# Patient Record
Sex: Female | Born: 1935 | Race: White | Hispanic: No | Smoking: Former smoker
Health system: Southern US, Community
[De-identification: ages and names within clinical notes are randomized; demographics above are authoritative.]

## PROBLEM LIST (undated history)

## (undated) DIAGNOSIS — N289 Disorder of kidney and ureter, unspecified: Secondary | ICD-10-CM

## (undated) DIAGNOSIS — H409 Unspecified glaucoma: Secondary | ICD-10-CM

## (undated) DIAGNOSIS — E049 Nontoxic goiter, unspecified: Secondary | ICD-10-CM

## (undated) DIAGNOSIS — I509 Heart failure, unspecified: Secondary | ICD-10-CM

## (undated) DIAGNOSIS — J449 Chronic obstructive pulmonary disease, unspecified: Secondary | ICD-10-CM

## (undated) DIAGNOSIS — S37009A Unspecified injury of unspecified kidney, initial encounter: Secondary | ICD-10-CM

## (undated) DIAGNOSIS — K635 Polyp of colon: Secondary | ICD-10-CM

## (undated) DIAGNOSIS — C801 Malignant (primary) neoplasm, unspecified: Secondary | ICD-10-CM

## (undated) DIAGNOSIS — D649 Anemia, unspecified: Secondary | ICD-10-CM

## (undated) DIAGNOSIS — E785 Hyperlipidemia, unspecified: Secondary | ICD-10-CM

## (undated) DIAGNOSIS — I10 Essential (primary) hypertension: Secondary | ICD-10-CM

## (undated) HISTORY — DX: Malignant (primary) neoplasm, unspecified: C80.1

## (undated) HISTORY — DX: Polyp of colon: K63.5

## (undated) HISTORY — DX: Chronic obstructive pulmonary disease, unspecified: J44.9

## (undated) HISTORY — DX: Unspecified glaucoma: H40.9

## (undated) HISTORY — DX: Essential (primary) hypertension: I10

## (undated) HISTORY — DX: Heart failure, unspecified: I50.9

## (undated) HISTORY — DX: Anemia, unspecified: D64.9

## (undated) HISTORY — DX: Hyperlipidemia, unspecified: E78.5

## (undated) HISTORY — DX: Nontoxic goiter, unspecified: E04.9

## (undated) HISTORY — DX: Disorder of kidney and ureter, unspecified: N28.9

## (undated) HISTORY — DX: Unspecified injury of unspecified kidney, initial encounter: S37.009A

---

## 1997-11-04 HISTORY — PX: ANKLE SURGERY: SHX546

## 2003-11-05 HISTORY — PX: CATARACT EXTRACTION: SUR2

## 2005-10-24 ENCOUNTER — Other Ambulatory Visit: Payer: Self-pay

## 2005-10-25 ENCOUNTER — Inpatient Hospital Stay: Payer: Self-pay

## 2005-11-04 DIAGNOSIS — C801 Malignant (primary) neoplasm, unspecified: Secondary | ICD-10-CM

## 2005-11-04 HISTORY — DX: Malignant (primary) neoplasm, unspecified: C80.1

## 2005-12-31 ENCOUNTER — Ambulatory Visit: Payer: Self-pay | Admitting: Vascular Surgery

## 2006-09-25 ENCOUNTER — Emergency Department: Payer: Self-pay | Admitting: Emergency Medicine

## 2006-12-27 IMAGING — CR LEFT THUMB 2+V
1 series · 3 of 3 positions shown · non-contrast
Comparison: none

REASON FOR EXAM: Injury
COMMENTS:

PROCEDURE:     DXR - DXR THUMB LEFT HAND (1ST DIGIT)  - September 25, 2006  [DATE]
RESULT:     No acute soft tissue or bony abnormality is identified. There is
no evidence of fracture or dislocation.

[Series 1: view not recorded · 0.17mm/px · 3 of 3 slices shown]
[im 1/3]
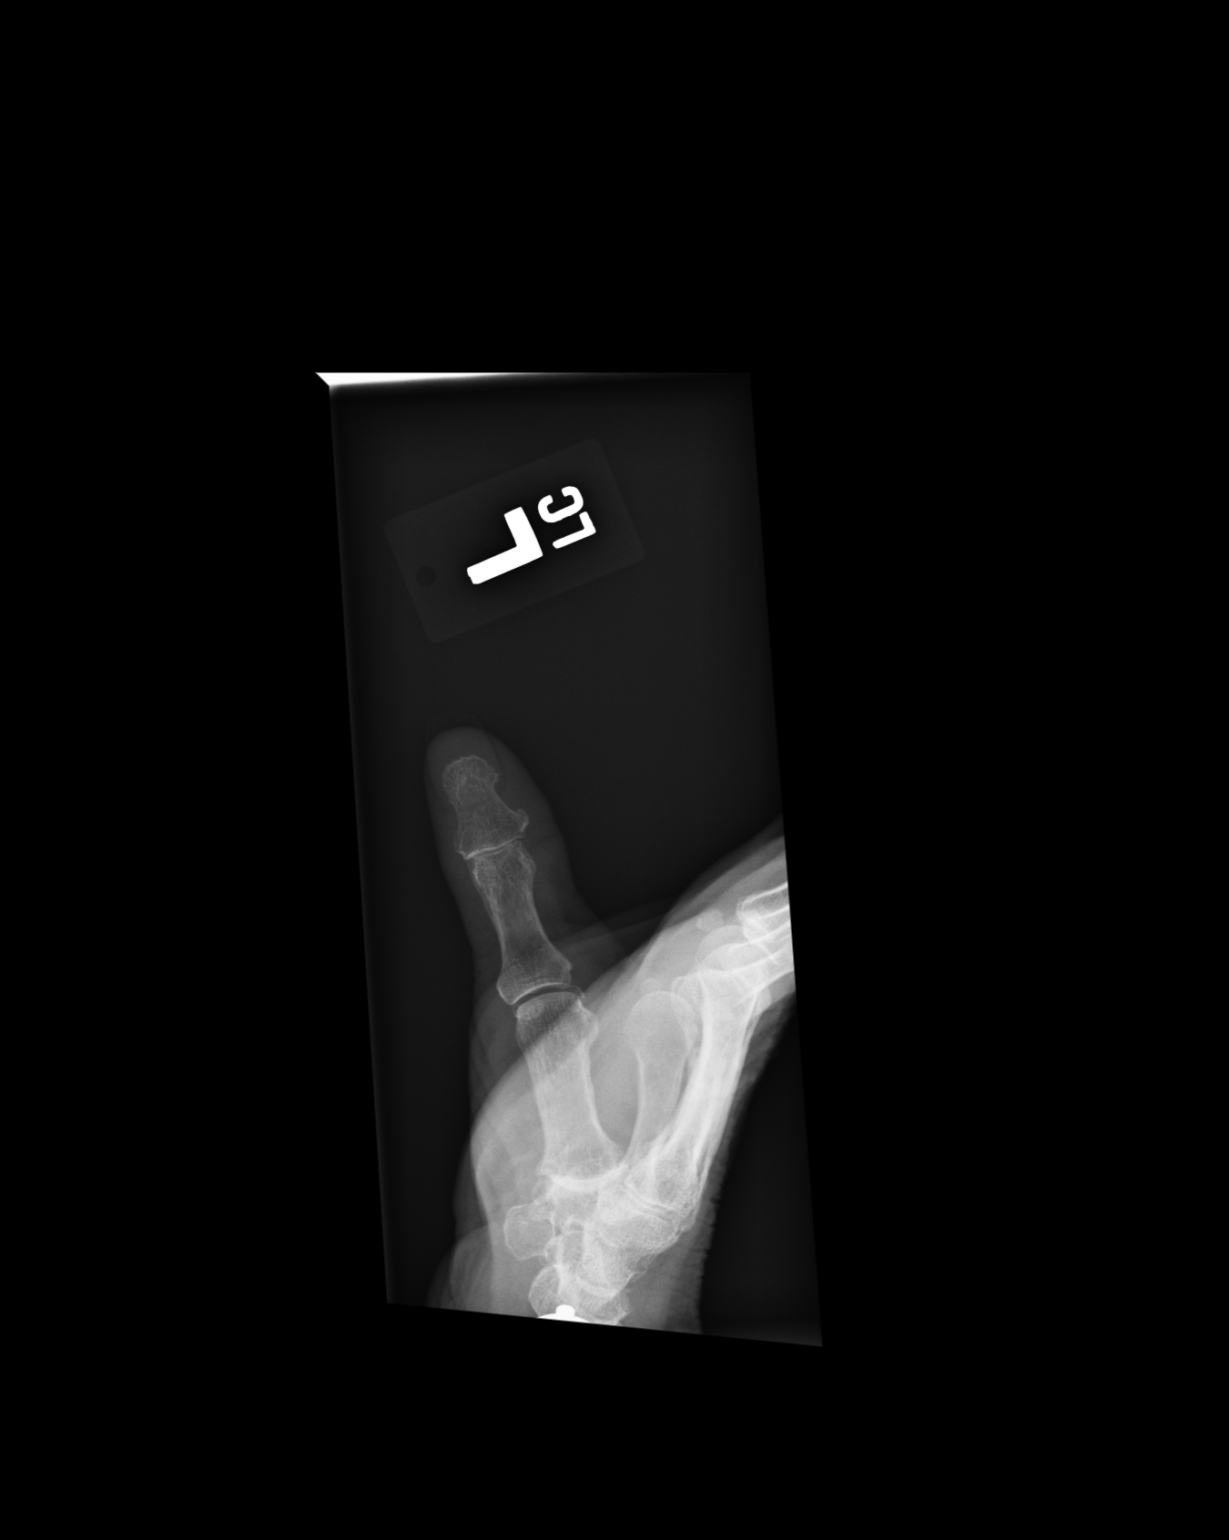
[im 2/3]
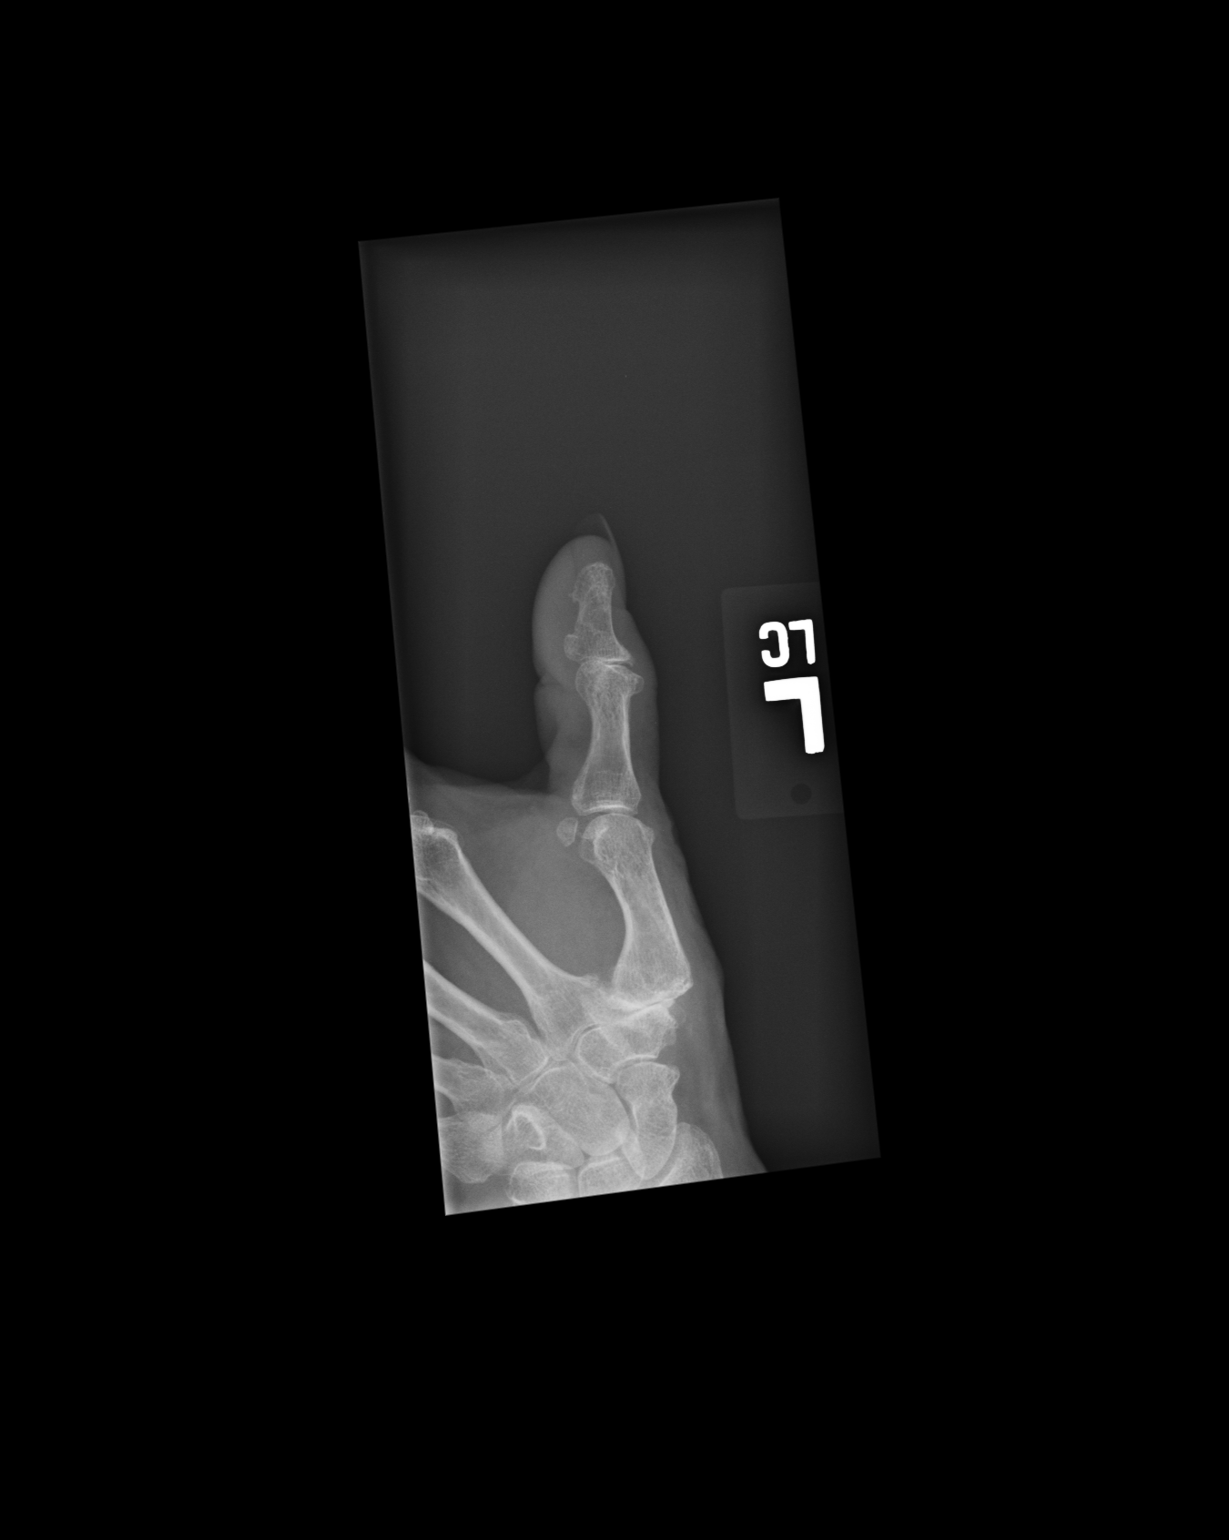
[im 3/3]
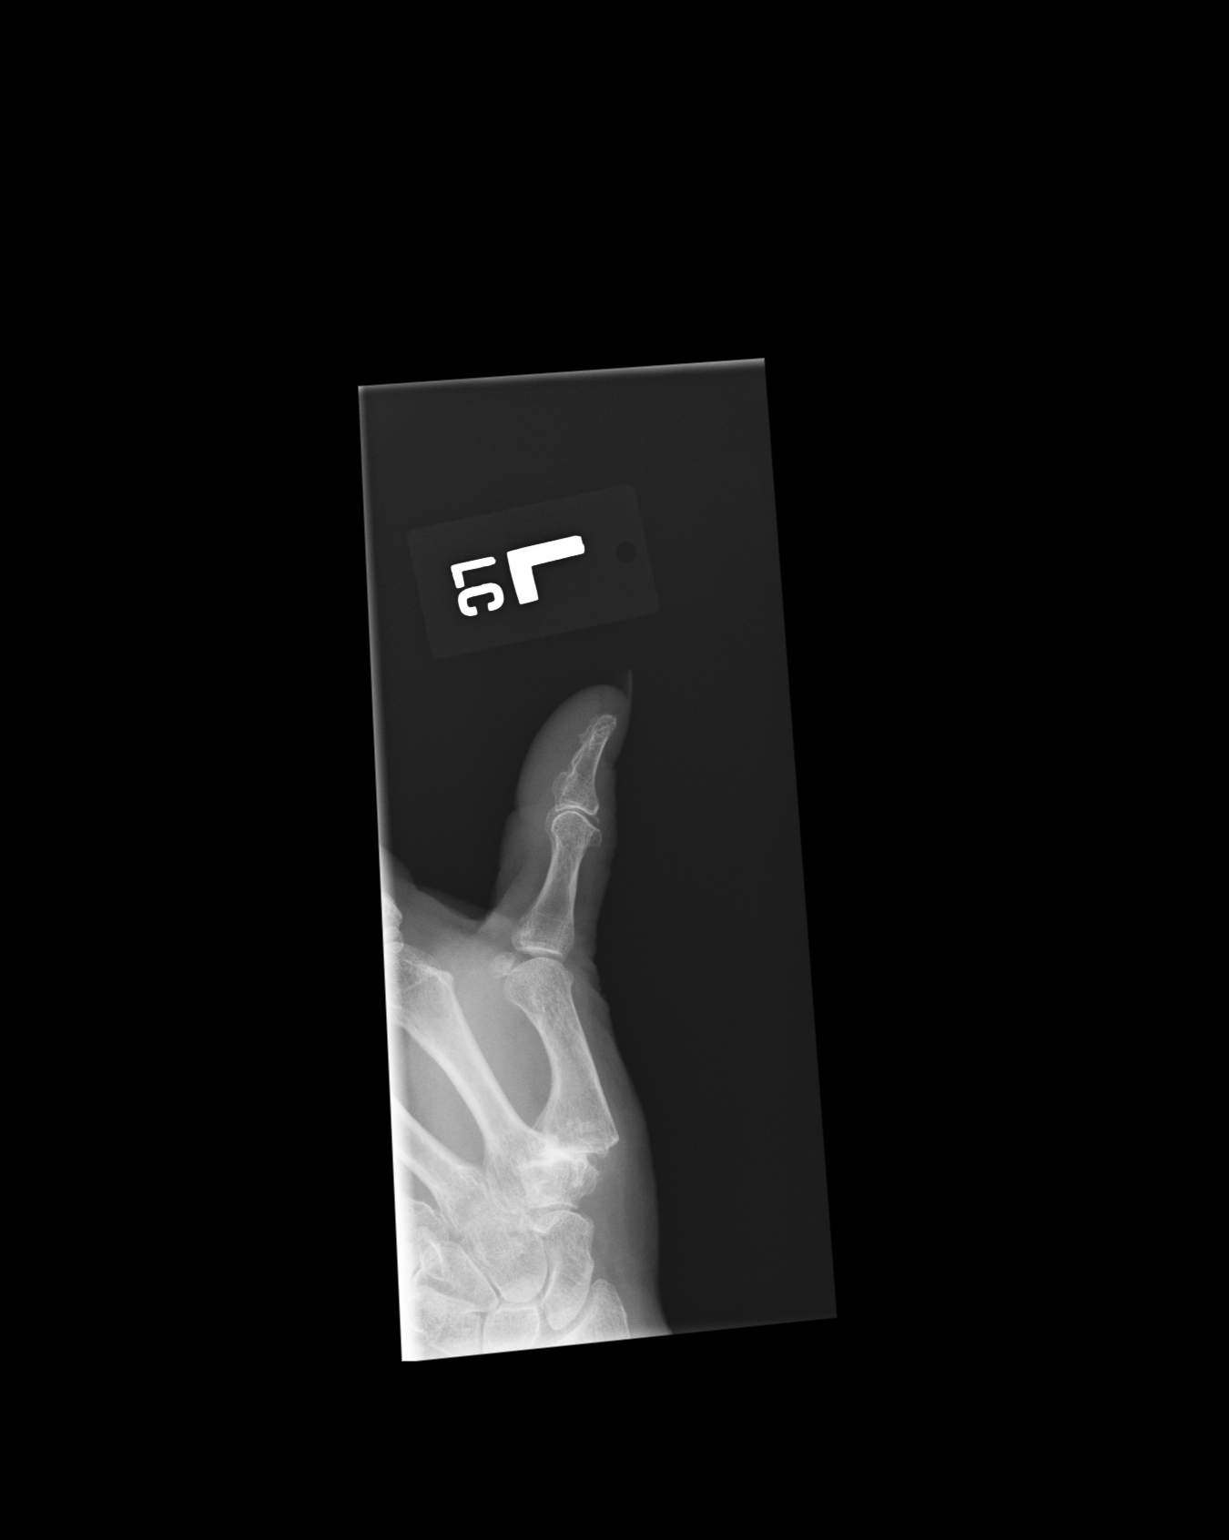

[3 of 3 positions shown; findings below may reference images not displayed]

IMPRESSION: No acute abnormality is identified.

## 2009-07-11 ENCOUNTER — Ambulatory Visit: Payer: Self-pay | Admitting: Nephrology

## 2009-08-04 ENCOUNTER — Ambulatory Visit: Payer: Self-pay | Admitting: Internal Medicine

## 2009-09-01 ENCOUNTER — Ambulatory Visit: Payer: Self-pay | Admitting: Internal Medicine

## 2009-11-04 ENCOUNTER — Inpatient Hospital Stay: Payer: Self-pay | Admitting: Internal Medicine

## 2009-12-22 ENCOUNTER — Inpatient Hospital Stay: Payer: Self-pay | Admitting: Internal Medicine

## 2010-03-01 ENCOUNTER — Ambulatory Visit: Payer: Self-pay | Admitting: Internal Medicine

## 2010-03-04 ENCOUNTER — Ambulatory Visit: Payer: Self-pay | Admitting: Internal Medicine

## 2010-09-12 ENCOUNTER — Ambulatory Visit: Payer: Self-pay | Admitting: Internal Medicine

## 2010-10-04 ENCOUNTER — Ambulatory Visit: Payer: Self-pay | Admitting: Internal Medicine

## 2011-09-13 ENCOUNTER — Ambulatory Visit: Payer: Self-pay | Admitting: Internal Medicine

## 2011-10-05 ENCOUNTER — Ambulatory Visit: Payer: Self-pay | Admitting: Internal Medicine

## 2011-10-30 ENCOUNTER — Ambulatory Visit: Payer: Self-pay | Admitting: Family Medicine

## 2011-11-05 DIAGNOSIS — E049 Nontoxic goiter, unspecified: Secondary | ICD-10-CM

## 2011-11-05 HISTORY — PX: EYE SURGERY: SHX253

## 2011-11-05 HISTORY — PX: BIOPSY THYROID: PRO38

## 2011-11-05 HISTORY — DX: Nontoxic goiter, unspecified: E04.9

## 2012-03-24 ENCOUNTER — Ambulatory Visit: Payer: Self-pay | Admitting: Family Medicine

## 2012-07-10 ENCOUNTER — Ambulatory Visit: Payer: Self-pay | Admitting: Family Medicine

## 2012-07-17 ENCOUNTER — Ambulatory Visit: Payer: Self-pay | Admitting: Family Medicine

## 2012-07-24 ENCOUNTER — Ambulatory Visit: Payer: Self-pay | Admitting: Vascular Surgery

## 2012-07-24 LAB — CREATININE, SERUM
EGFR (African American): 40 — ABNORMAL LOW
EGFR (Non-African Amer.): 35 — ABNORMAL LOW

## 2012-09-11 ENCOUNTER — Ambulatory Visit: Payer: Self-pay | Admitting: Internal Medicine

## 2012-09-11 LAB — CREATININE, SERUM
Creatinine: 1.48 mg/dL — ABNORMAL HIGH (ref 0.60–1.30)
EGFR (African American): 37 — ABNORMAL LOW
EGFR (Non-African Amer.): 32 — ABNORMAL LOW

## 2012-09-11 LAB — CBC CANCER CENTER
Basophil #: 0 x10 3/mm (ref 0.0–0.1)
Eosinophil #: 0.2 x10 3/mm (ref 0.0–0.7)
HCT: 36.5 % (ref 35.0–47.0)
HGB: 11.8 g/dL — ABNORMAL LOW (ref 12.0–16.0)
Lymphocyte #: 2.4 x10 3/mm (ref 1.0–3.6)
MCH: 33.8 pg (ref 26.0–34.0)
MCHC: 32.5 g/dL (ref 32.0–36.0)
MCV: 104 fL — ABNORMAL HIGH (ref 80–100)
Monocyte #: 0.6 x10 3/mm (ref 0.2–0.9)
Monocyte %: 6.5 %
Neutrophil #: 6.6 x10 3/mm — ABNORMAL HIGH (ref 1.4–6.5)
Platelet: 295 x10 3/mm (ref 150–440)
RBC: 3.5 10*6/uL — ABNORMAL LOW (ref 3.80–5.20)
RDW: 14.9 % — ABNORMAL HIGH (ref 11.5–14.5)

## 2012-09-11 LAB — CALCIUM: Calcium, Total: 8.9 mg/dL (ref 8.5–10.1)

## 2012-09-14 LAB — PROT IMMUNOELECTROPHORES(ARMC)

## 2012-09-25 ENCOUNTER — Emergency Department: Payer: Self-pay | Admitting: Emergency Medicine

## 2012-09-25 LAB — COMPREHENSIVE METABOLIC PANEL
BUN: 13 mg/dL (ref 7–18)
Bilirubin,Total: 0.4 mg/dL (ref 0.2–1.0)
Calcium, Total: 9.2 mg/dL (ref 8.5–10.1)
Chloride: 95 mmol/L — ABNORMAL LOW (ref 98–107)
Creatinine: 0.93 mg/dL (ref 0.60–1.30)
EGFR (African American): >60
EGFR (Non-African Amer.): 55 — ABNORMAL LOW
Glucose: 110 mg/dL — ABNORMAL HIGH (ref 65–99)
Potassium: 4.2 mmol/L (ref 3.5–5.1)
SGOT(AST): 22 U/L (ref 15–37)
SGPT (ALT): 27 U/L (ref 12–78)

## 2012-09-25 LAB — URINALYSIS, COMPLETE
Bacteria: NONE SEEN
Bilirubin,UR: NEGATIVE
Blood: NEGATIVE
Glucose,UR: NEGATIVE mg/dL (ref 0–75)
Nitrite: NEGATIVE
Protein: NEGATIVE
Squamous Epithelial: <1

## 2012-09-25 LAB — TROPONIN I: Troponin-I: 0.02 ng/mL

## 2012-09-25 LAB — CBC
HCT: 34.5 % — ABNORMAL LOW (ref 35.0–47.0)
MCHC: 33.4 g/dL (ref 32.0–36.0)
Platelet: 340 10*3/uL (ref 150–440)
RDW: 14.1 % (ref 11.5–14.5)
WBC: 6.6 10*3/uL (ref 3.6–11.0)

## 2012-09-25 LAB — CK TOTAL AND CKMB (NOT AT ARMC): CK-MB: 1.4 ng/mL (ref 0.5–3.6)

## 2012-10-04 ENCOUNTER — Ambulatory Visit: Payer: Self-pay | Admitting: Internal Medicine

## 2012-12-27 IMAGING — CR DG CHEST 2V
1 series · 2 of 2 positions shown · non-contrast
Comparison: none

REASON FOR EXAM: weak
COMMENTS:

[Series 1: w chest pa · 0.14mm/px · 2 of 2 slices shown]
[im 1/2]
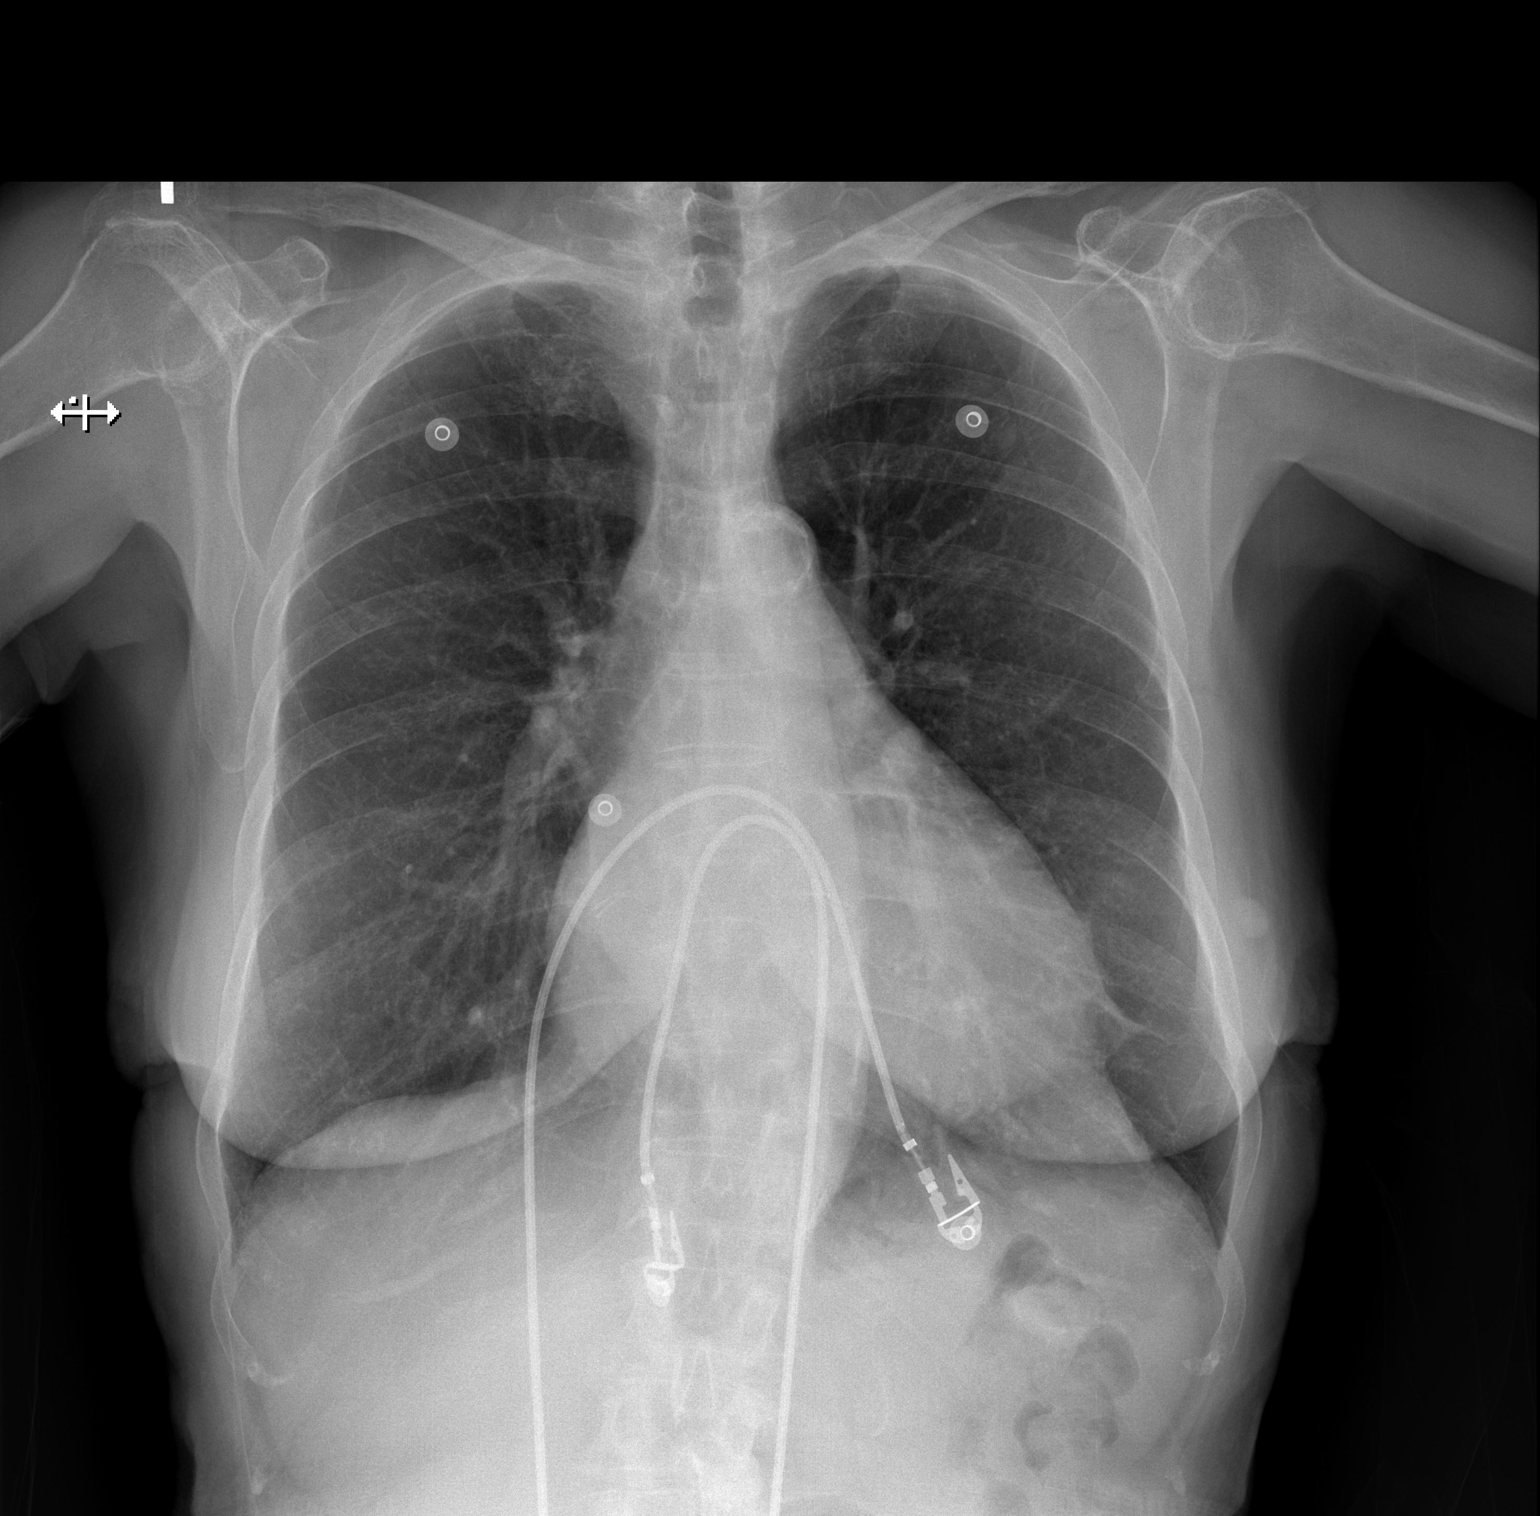
[im 2/2]
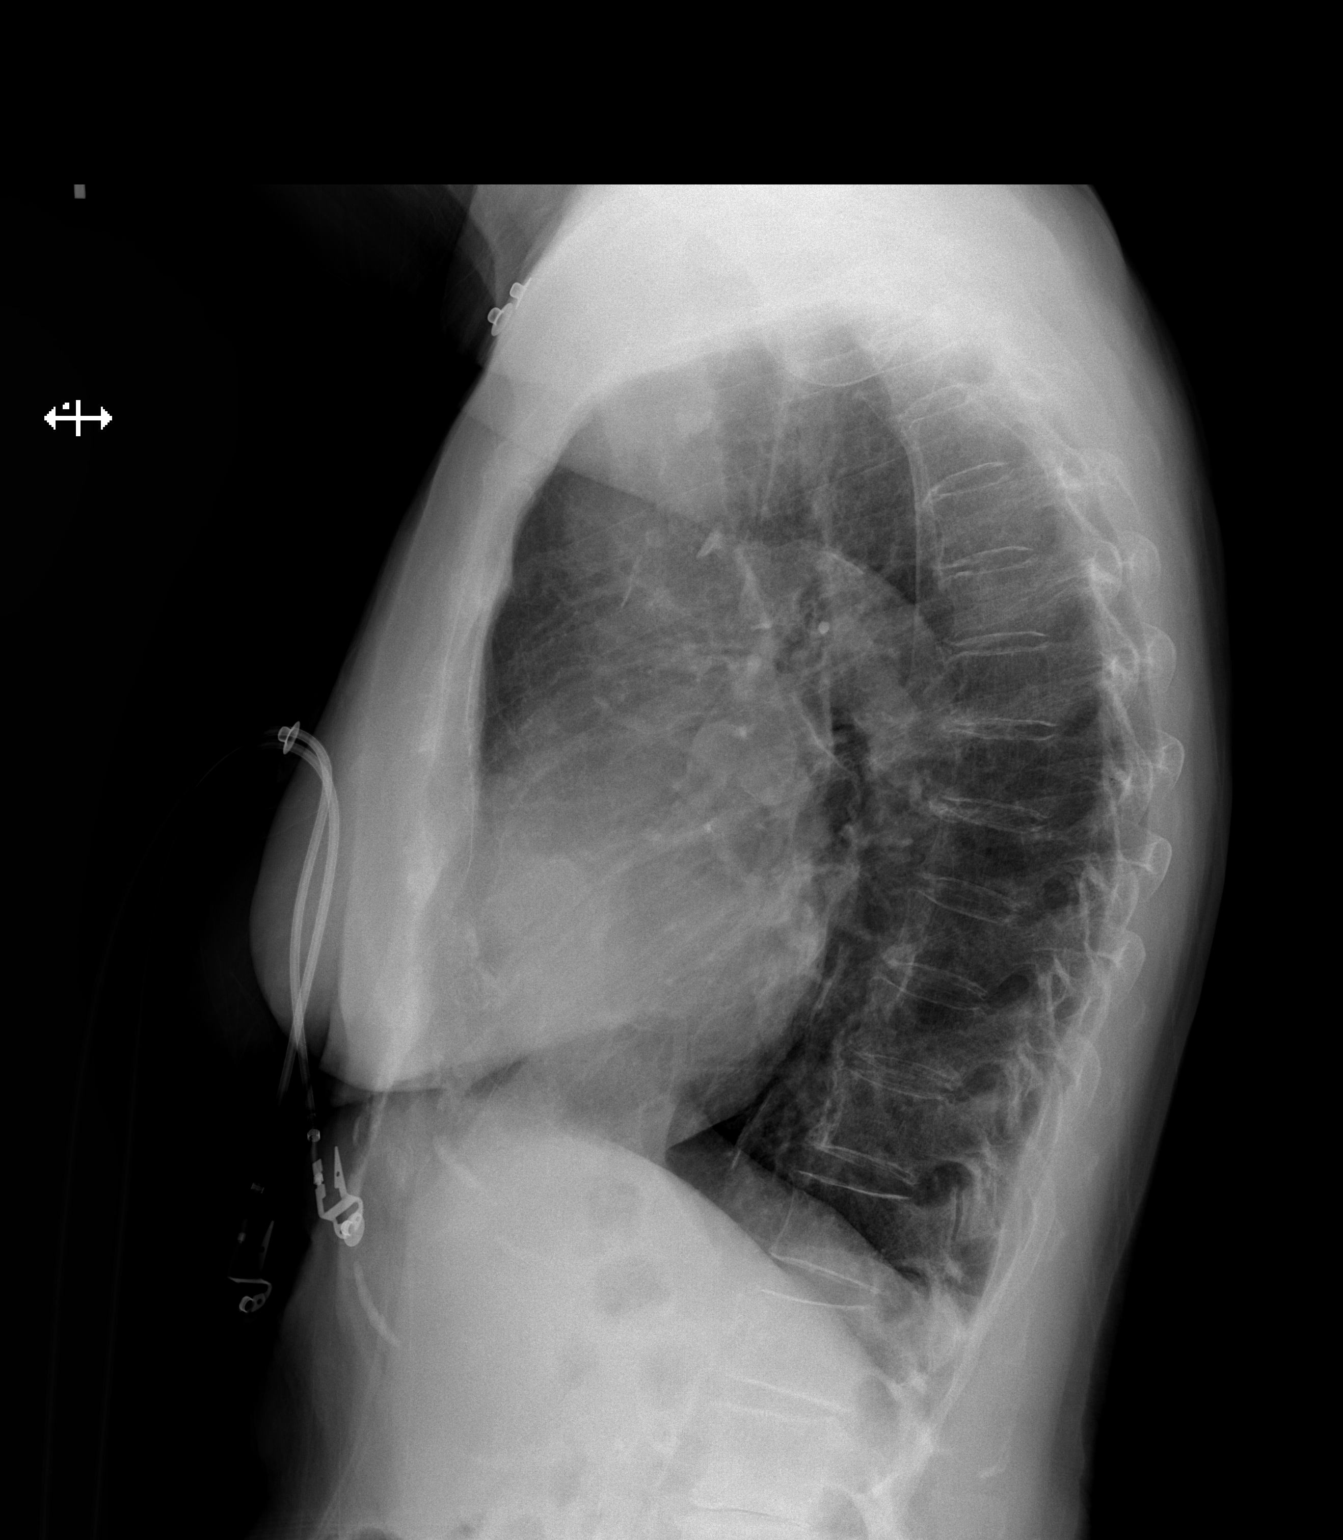

[2 of 2 positions shown; findings below may reference images not displayed]

PROCEDURE:     DXR - DXR CHEST PA (OR AP) AND LATERAL  - September 25, 2012  [DATE]

RESULT:

There is no evidence of focal infiltrates, effusions or edema. The cardiac
silhouette is enlarged indicative of cardiomegaly. The visualized bony
skeleton is unremarkable. This study was compared to a previous study dated
12/21/2009.
IMPRESSION: Cardiomegaly without evidence of acute cardiopulmonary
disease.

## 2013-01-18 ENCOUNTER — Ambulatory Visit: Payer: Self-pay | Admitting: Family Medicine

## 2013-01-25 ENCOUNTER — Ambulatory Visit: Payer: Self-pay | Admitting: Orthopedic Surgery

## 2013-02-01 ENCOUNTER — Ambulatory Visit: Payer: Self-pay | Admitting: Hematology and Oncology

## 2013-02-02 ENCOUNTER — Ambulatory Visit: Payer: Self-pay | Admitting: Hematology and Oncology

## 2013-02-02 ENCOUNTER — Ambulatory Visit (INDEPENDENT_AMBULATORY_CARE_PROVIDER_SITE_OTHER): Payer: Medicare Other | Admitting: General Surgery

## 2013-02-02 ENCOUNTER — Encounter: Payer: Self-pay | Admitting: General Surgery

## 2013-02-02 VITALS — BP 104/56 | HR 70 | Resp 12 | Ht <= 58 in | Wt 106.0 lb

## 2013-02-02 DIAGNOSIS — C799 Secondary malignant neoplasm of unspecified site: Secondary | ICD-10-CM

## 2013-02-02 DIAGNOSIS — I10 Essential (primary) hypertension: Secondary | ICD-10-CM

## 2013-02-02 DIAGNOSIS — C801 Malignant (primary) neoplasm, unspecified: Secondary | ICD-10-CM

## 2013-02-02 DIAGNOSIS — D638 Anemia in other chronic diseases classified elsewhere: Secondary | ICD-10-CM

## 2013-02-02 DIAGNOSIS — I509 Heart failure, unspecified: Secondary | ICD-10-CM

## 2013-02-02 LAB — CBC CANCER CENTER
Basophil #: 0.1 x10 3/mm (ref 0.0–0.1)
Eosinophil #: 1.4 x10 3/mm — ABNORMAL HIGH (ref 0.0–0.7)
Eosinophil %: 12.7 %
HGB: 9.2 g/dL — ABNORMAL LOW (ref 12.0–16.0)
Lymphocyte #: 1.4 x10 3/mm (ref 1.0–3.6)
Lymphocyte %: 12.8 %
Platelet: 438 x10 3/mm (ref 150–440)
RDW: 16.6 % — ABNORMAL HIGH (ref 11.5–14.5)
WBC: 11.3 x10 3/mm — ABNORMAL HIGH (ref 3.6–11.0)

## 2013-02-02 LAB — COMPREHENSIVE METABOLIC PANEL
Albumin: 2.9 g/dL — ABNORMAL LOW (ref 3.4–5.0)
Alkaline Phosphatase: 109 U/L (ref 50–136)
Anion Gap: 9 (ref 7–16)
Calcium, Total: 9.2 mg/dL (ref 8.5–10.1)
Co2: 30 mmol/L (ref 21–32)
Creatinine: 1.2 mg/dL (ref 0.60–1.30)
EGFR (African American): 47 — ABNORMAL LOW
EGFR (Non-African Amer.): 40 — ABNORMAL LOW
Glucose: 106 mg/dL — ABNORMAL HIGH (ref 65–99)
Potassium: 3.7 mmol/L (ref 3.5–5.1)
SGOT(AST): 13 U/L — ABNORMAL LOW (ref 15–37)
SGPT (ALT): 20 U/L (ref 12–78)

## 2013-02-02 LAB — LACTATE DEHYDROGENASE: LDH: 241 U/L (ref 81–246)

## 2013-02-02 LAB — APTT: Activated PTT: 30.5 secs (ref 23.6–35.9)

## 2013-02-02 LAB — PROTIME-INR: INR: 1.1

## 2013-02-02 NOTE — Patient Instructions (Signed)
Talk with Dr Sherrlyn Hock to get biopsy done with CT scan

## 2013-02-02 NOTE — Progress Notes (Signed)
Patient ID: Savana Spina, female   DOB: Jul 28, 1923, 79 y.o.   MRN: [id number]  Chief Complaint  Patient presents with  . Other    hip and shoulder tumor    HPI Cherri Yera is a 79 y.o. female.  Patient here today with her daughter Bonita Quin, referred by Dr Sherrlyn Hock for right hip and right shoulder tumors.  Recent PET scan showed tumors.  Followed by Dr Cherylann Ratel, Dr Sullivan Lone and Dr Sherrlyn Hock. Originally seen by orthopedic physician. Pain in hip when she walks. HPI  Past Medical History  Diagnosis Date  . Cancer 2007    basil cell  . Thyroid enlargement 2013  . Glaucoma   . Kidney damage     elevated proteins Stage III  . CHF (congestive heart failure)     45-50%  . Anemia   . Colon polyp   . COPD (chronic obstructive pulmonary disease)   . Hyperlipidemia   . Hypertension     Past Surgical History  Procedure Laterality Date  . Ankle surgery Right 1999  . Cataract extraction Bilateral 2005  . Biopsy thyroid  2013  . Eye surgery Left 2013    Family History  Problem Relation Age of Onset  . Cancer Brother     lung    Social History History  Substance Use Topics  . Smoking status: Former Games developer  . Smokeless tobacco: Former Neurosurgeon    Quit date: 11/04/1978  . Alcohol Use: No    Allergies  Allergen Reactions  . Gabapentin   . Tramadol   . Ivp Dye (Iodinated Diagnostic Agents) Itching and Rash    Current Outpatient Prescriptions  Medication Sig Dispense Refill  . acetaminophen (TYLENOL) 325 MG tablet Take 325 mg by mouth as needed for pain.      Marland Kitchen amLODipine-benazepril (LOTREL) 5-20 MG per capsule Take 1 capsule by mouth daily.      Marland Kitchen aspirin 81 MG tablet Take 81 mg by mouth daily.      . Calcium Carbonate-Vitamin D (CALCIUM + D PO) Take 3 tablets by mouth daily.      . digoxin (LANOXIN) 0.125 MG tablet Take 0.125 mg by mouth daily.      . Dorzolamide HCl-Timolol Mal PF (COSOPT PF) 22.3-6.8 MG/ML SOLN Place 1 drop into the left eye 2 (two) times daily.      . ferrous sulfate  325 (65 FE) MG tablet Take 3 tablets by mouth daily with breakfast.      . Fluticasone-Salmeterol (ADVAIR) 250-50 MCG/DOSE AEPB Inhale 1 puff into the lungs every 12 (twelve) hours.      . furosemide (LASIX) 20 MG tablet Take 20 mg by mouth daily.      Marland Kitchen HYDROcodone-acetaminophen (NORCO/VICODIN) 5-325 MG per tablet Take 1 tablet by mouth every 8 (eight) hours as needed for pain.      . magnesium oxide (MAG-OX) 400 MG tablet Take 400 mg by mouth daily.      . metoprolol succinate (TOPROL-XL) 25 MG 24 hr tablet Take 25 mg by mouth daily.      . Multiple Vitamin (MULTIVITAMIN) tablet Take 1 tablet by mouth daily.      . Omega-3 Fatty Acids (FISH OIL) 1200 MG CAPS Take 2 capsules by mouth 2 (two) times daily.      Marland Kitchen omeprazole (PRILOSEC) 20 MG capsule Take 20 mg by mouth daily.      . pregabalin (LYRICA) 50 MG capsule Take 50 mg by mouth 2 (two) times daily.  No current facility-administered medications for this visit.    Review of Systems Review of Systems  Constitutional: Negative.   Respiratory: Negative.   Cardiovascular: Negative.   Neurological: Positive for headaches.  Headaches are described as chronic for about one year. Complaints of left facial numbness but has had a nerve block January 07 2013. Blood pressure 104/56, pulse 70, resp. rate 12, height 4\' 8"  (1.422 m), weight 106 lb (48.081 kg).  Physical Exam Physical Exam  Constitutional: She is oriented to person, place, and time. She appears well-developed and well-nourished.  HENT:  Right Ear: Decreased hearing is noted.  Left Ear: Decreased hearing is noted.  Cardiovascular: Normal rate and regular rhythm.   Pulmonary/Chest: Effort normal and breath sounds normal.  Lymphadenopathy:    She has no cervical adenopathy.    She has no axillary adenopathy.  Neurological: She is alert and oriented to person, place, and time.  Skin: Skin is warm and dry.    Data Reviewed Past CT of March 2014 was reviewed. It showed areas  of increased uptake in both the area of the right suprascapular area as well as at the right posterior ilium concerning for metastatic disease versus multiple myeloma. Hypermetabolic anterior mediastinal, right lower paratracheal and right hilar lymphadenopathy is noted. Plain films of the right shoulder dated January 18, 2013 were unremarkable.  Ultrasound examination of the right axilla and right gluteal area failed to demonstrate a mass corresponding to that notable on CT.  Assessment    Metastatic carcinoma, unknown primary     Plan    The case was reviewed with Janese Banks, M.D. By phone. The patient will benefit from a CT-guided biopsy. Will discuss with radiology service making use of a 10-gauge biopsy device so adequate tissue samples are obtained.        Earline Mayotte 02/03/2013, 6:27 PM

## 2013-02-03 ENCOUNTER — Encounter: Payer: Self-pay | Admitting: General Surgery

## 2013-02-03 DIAGNOSIS — D638 Anemia in other chronic diseases classified elsewhere: Secondary | ICD-10-CM | POA: Insufficient documentation

## 2013-02-03 DIAGNOSIS — I509 Heart failure, unspecified: Secondary | ICD-10-CM | POA: Insufficient documentation

## 2013-02-03 DIAGNOSIS — C799 Secondary malignant neoplasm of unspecified site: Secondary | ICD-10-CM | POA: Insufficient documentation

## 2013-02-03 DIAGNOSIS — I10 Essential (primary) hypertension: Secondary | ICD-10-CM | POA: Insufficient documentation

## 2013-02-03 LAB — URINE IEP, RANDOM

## 2013-02-11 ENCOUNTER — Ambulatory Visit: Payer: Self-pay | Admitting: Internal Medicine

## 2013-03-04 ENCOUNTER — Ambulatory Visit: Payer: Self-pay | Admitting: Hematology and Oncology

## 2013-03-17 LAB — CBC CANCER CENTER
Basophil %: 0.4 %
Eosinophil #: 0.7 x10 3/mm (ref 0.0–0.7)
HCT: 25.1 % — ABNORMAL LOW (ref 35.0–47.0)
Lymphocyte #: 2.2 x10 3/mm (ref 1.0–3.6)
MCHC: 31.3 g/dL — ABNORMAL LOW (ref 32.0–36.0)
Monocyte #: 1.2 x10 3/mm — ABNORMAL HIGH (ref 0.2–0.9)
Neutrophil %: 71.1 %
RBC: 2.8 10*6/uL — ABNORMAL LOW (ref 3.80–5.20)
RDW: 17.6 % — ABNORMAL HIGH (ref 11.5–14.5)
WBC: 14.6 x10 3/mm — ABNORMAL HIGH (ref 3.6–11.0)

## 2013-03-17 LAB — BASIC METABOLIC PANEL
Anion Gap: 13 (ref 7–16)
BUN: 19 mg/dL — ABNORMAL HIGH (ref 7–18)
Calcium, Total: 9.3 mg/dL (ref 8.5–10.1)
Chloride: 103 mmol/L (ref 98–107)
EGFR (African American): 49 — ABNORMAL LOW
EGFR (Non-African Amer.): 42 — ABNORMAL LOW
Osmolality: 279 (ref 275–301)
Potassium: 4.3 mmol/L (ref 3.5–5.1)
Sodium: 139 mmol/L (ref 136–145)

## 2013-03-17 LAB — HEPATIC FUNCTION PANEL A (ARMC)
Albumin: 2.5 g/dL — ABNORMAL LOW (ref 3.4–5.0)
Alkaline Phosphatase: 84 U/L (ref 50–136)
Bilirubin, Direct: 0.1 mg/dL (ref 0.00–0.20)
Bilirubin,Total: 0.3 mg/dL (ref 0.2–1.0)
SGPT (ALT): 22 U/L (ref 12–78)
Total Protein: 6.9 g/dL (ref 6.4–8.2)

## 2013-03-24 LAB — CBC CANCER CENTER
Basophil #: 0.1 x10 3/mm (ref 0.0–0.1)
Eosinophil #: 0.6 x10 3/mm (ref 0.0–0.7)
Eosinophil %: 4 %
HCT: 24.8 % — ABNORMAL LOW (ref 35.0–47.0)
Lymphocyte #: 1.7 x10 3/mm (ref 1.0–3.6)
Lymphocyte %: 11.3 %
MCV: 87 fL (ref 80–100)
Monocyte #: 1.2 x10 3/mm — ABNORMAL HIGH (ref 0.2–0.9)
Neutrophil #: 11.4 x10 3/mm — ABNORMAL HIGH (ref 1.4–6.5)
Neutrophil %: 75.9 %
RDW: 17.7 % — ABNORMAL HIGH (ref 11.5–14.5)
WBC: 15 x10 3/mm — ABNORMAL HIGH (ref 3.6–11.0)

## 2013-03-24 LAB — IRON AND TIBC
Iron Saturation: 9 %
Unbound Iron-Bind.Cap.: 223 ug/dL

## 2013-03-24 LAB — FERRITIN: Ferritin (ARMC): 212 ng/mL (ref 8–388)

## 2013-03-28 ENCOUNTER — Inpatient Hospital Stay: Payer: Self-pay | Admitting: Internal Medicine

## 2013-03-28 LAB — CBC
HGB: 7.9 g/dL — ABNORMAL LOW (ref 12.0–16.0)
MCH: 28.4 pg (ref 26.0–34.0)
Platelet: 381 10*3/uL (ref 150–440)
RBC: 2.79 10*6/uL — ABNORMAL LOW (ref 3.80–5.20)
WBC: 14.8 10*3/uL — ABNORMAL HIGH (ref 3.6–11.0)

## 2013-03-28 LAB — URINALYSIS, COMPLETE
Glucose,UR: 50 mg/dL (ref 0–75)
Hyaline Cast: 1
Nitrite: NEGATIVE
Ph: 7 (ref 4.5–8.0)
Protein: NEGATIVE
Specific Gravity: 1.017 (ref 1.003–1.030)
WBC UR: 12 /HPF (ref 0–5)

## 2013-03-28 LAB — BASIC METABOLIC PANEL
Anion Gap: 13 (ref 7–16)
BUN: 20 mg/dL — ABNORMAL HIGH (ref 7–18)
Calcium, Total: 9.8 mg/dL (ref 8.5–10.1)
Chloride: 110 mmol/L — ABNORMAL HIGH (ref 98–107)
Creatinine: 0.93 mg/dL (ref 0.60–1.30)
EGFR (African American): >60
EGFR (Non-African Amer.): 55 — ABNORMAL LOW
Osmolality: 290 (ref 275–301)
Potassium: 3.7 mmol/L (ref 3.5–5.1)
Sodium: 143 mmol/L (ref 136–145)

## 2013-04-04 ENCOUNTER — Ambulatory Visit: Payer: Self-pay | Admitting: Hematology and Oncology

## 2013-04-04 DEATH — deceased

## 2013-06-29 IMAGING — CR PELVIS - 1-2 VIEW
1 series · 2 of 2 positions shown · non-contrast
Comparison: none

REASON FOR EXAM: bilateral hip pain
COMMENTS:

PROCEDURE:     DXR - DXR PELVIS AP ONLY  - March 28, 2013  [DATE]
RESULT:     Comparison: None.

[Series 1: t pelvis ap · 0.14mm/px · 2 of 2 slices shown]
[im 1/2]
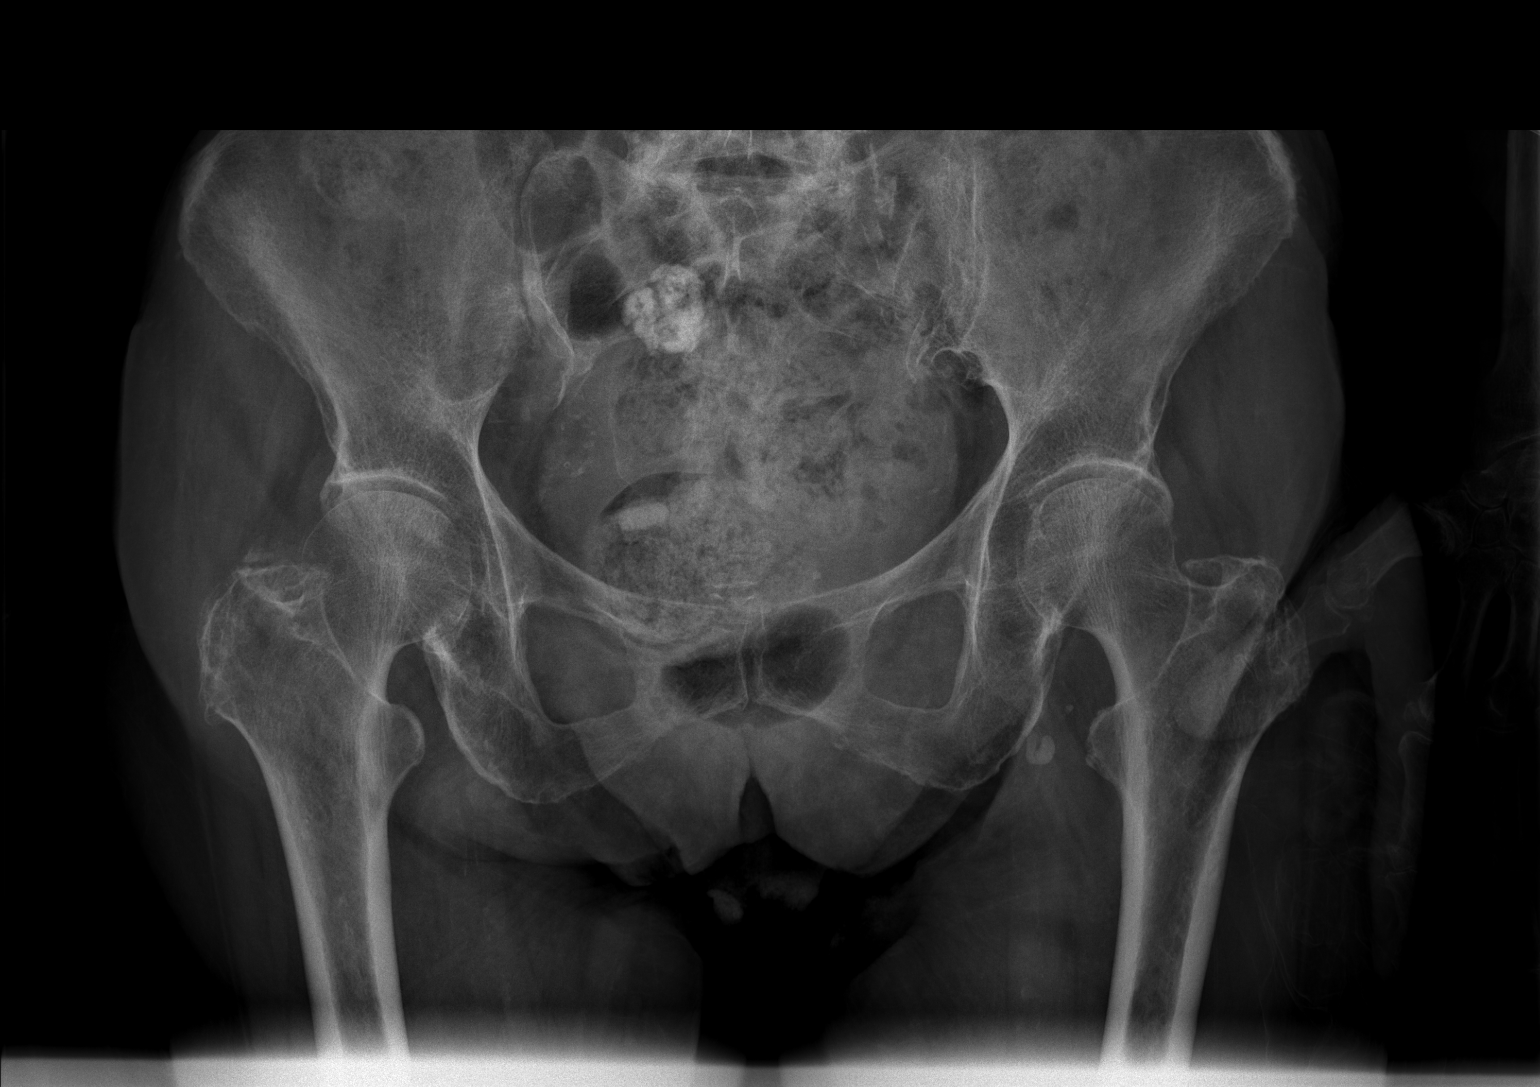
[im 2/2]
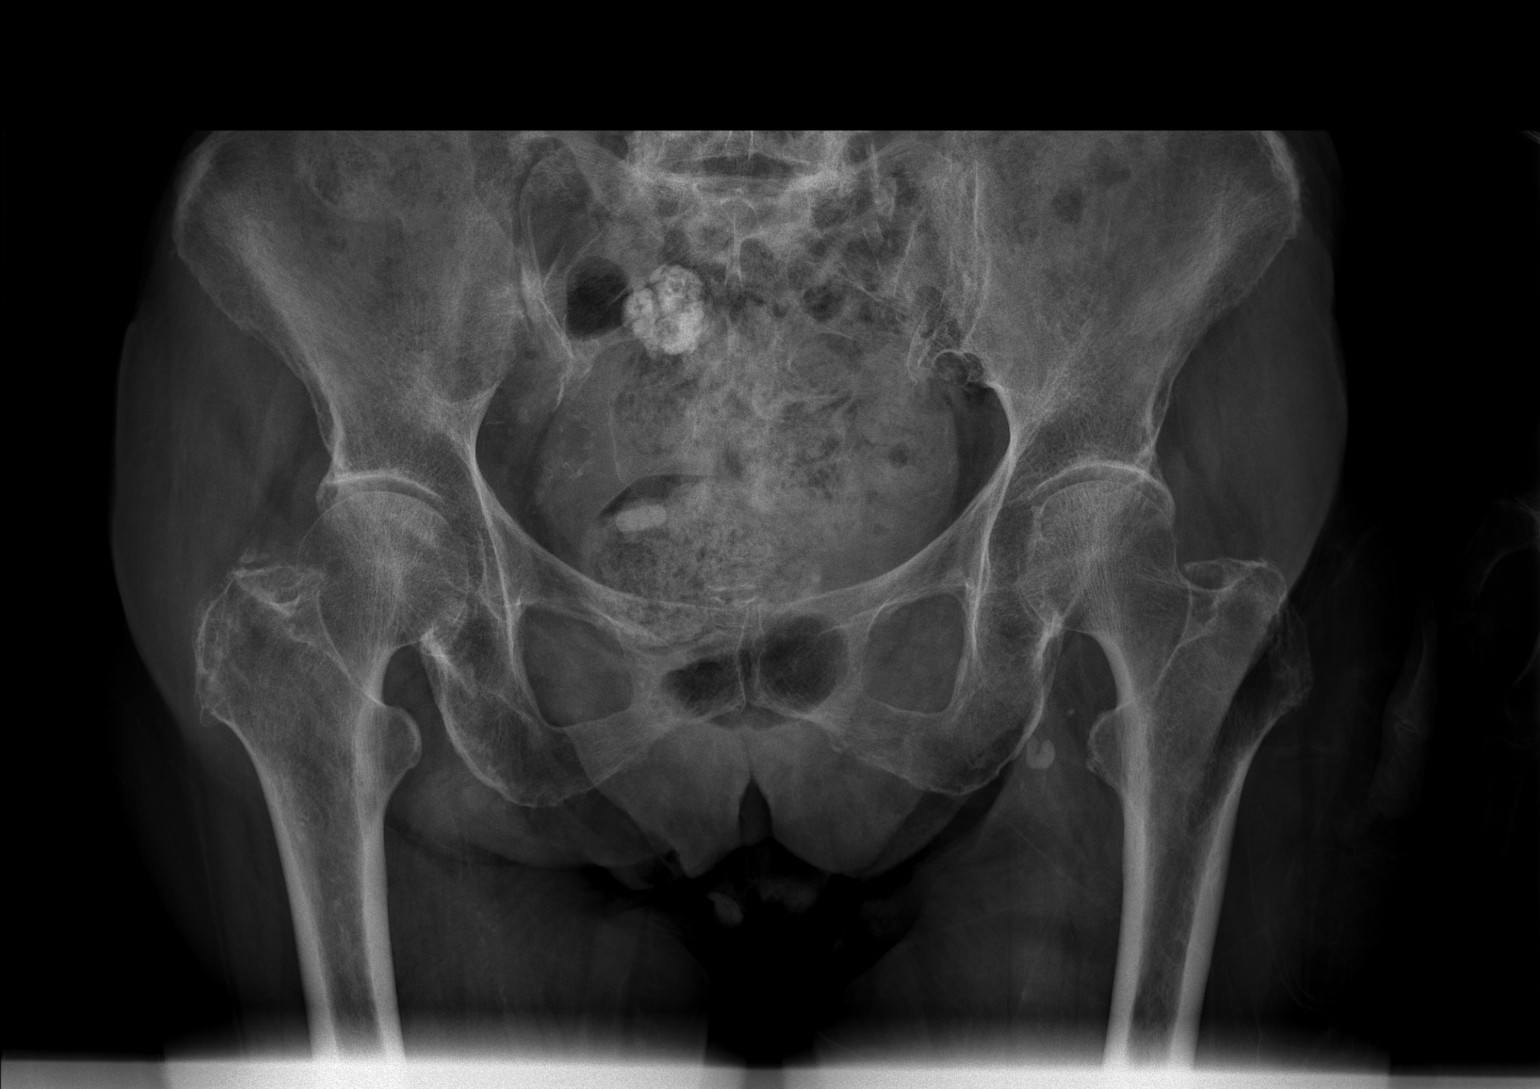

[2 of 2 positions shown; findings below may reference images not displayed]

FINDINGS: No definite fracture seen. Evaluation of the sacrum is limited by overlying
stool and bowel gas. Subtle lucency overlying the right iliac wing likely
represents a lytic lesion seen on the prior PET CT.
IMPRESSION: No definite fracture seen.

[REDACTED]

## 2013-06-29 IMAGING — CR RIGHT HIP - COMPLETE 2+ VIEW
1 series · 2 of 2 positions shown · non-contrast
Comparison: none

REASON FOR EXAM: hip pain
COMMENTS:

PROCEDURE:     DXR - DXR HIP RIGHT COMPLETE  - March 28, 2013  [DATE]
RESULT:     Comparison: None.

[Series 1: t hip ap right · 0.14mm/px · 2 of 2 slices shown]
[im 1/2]
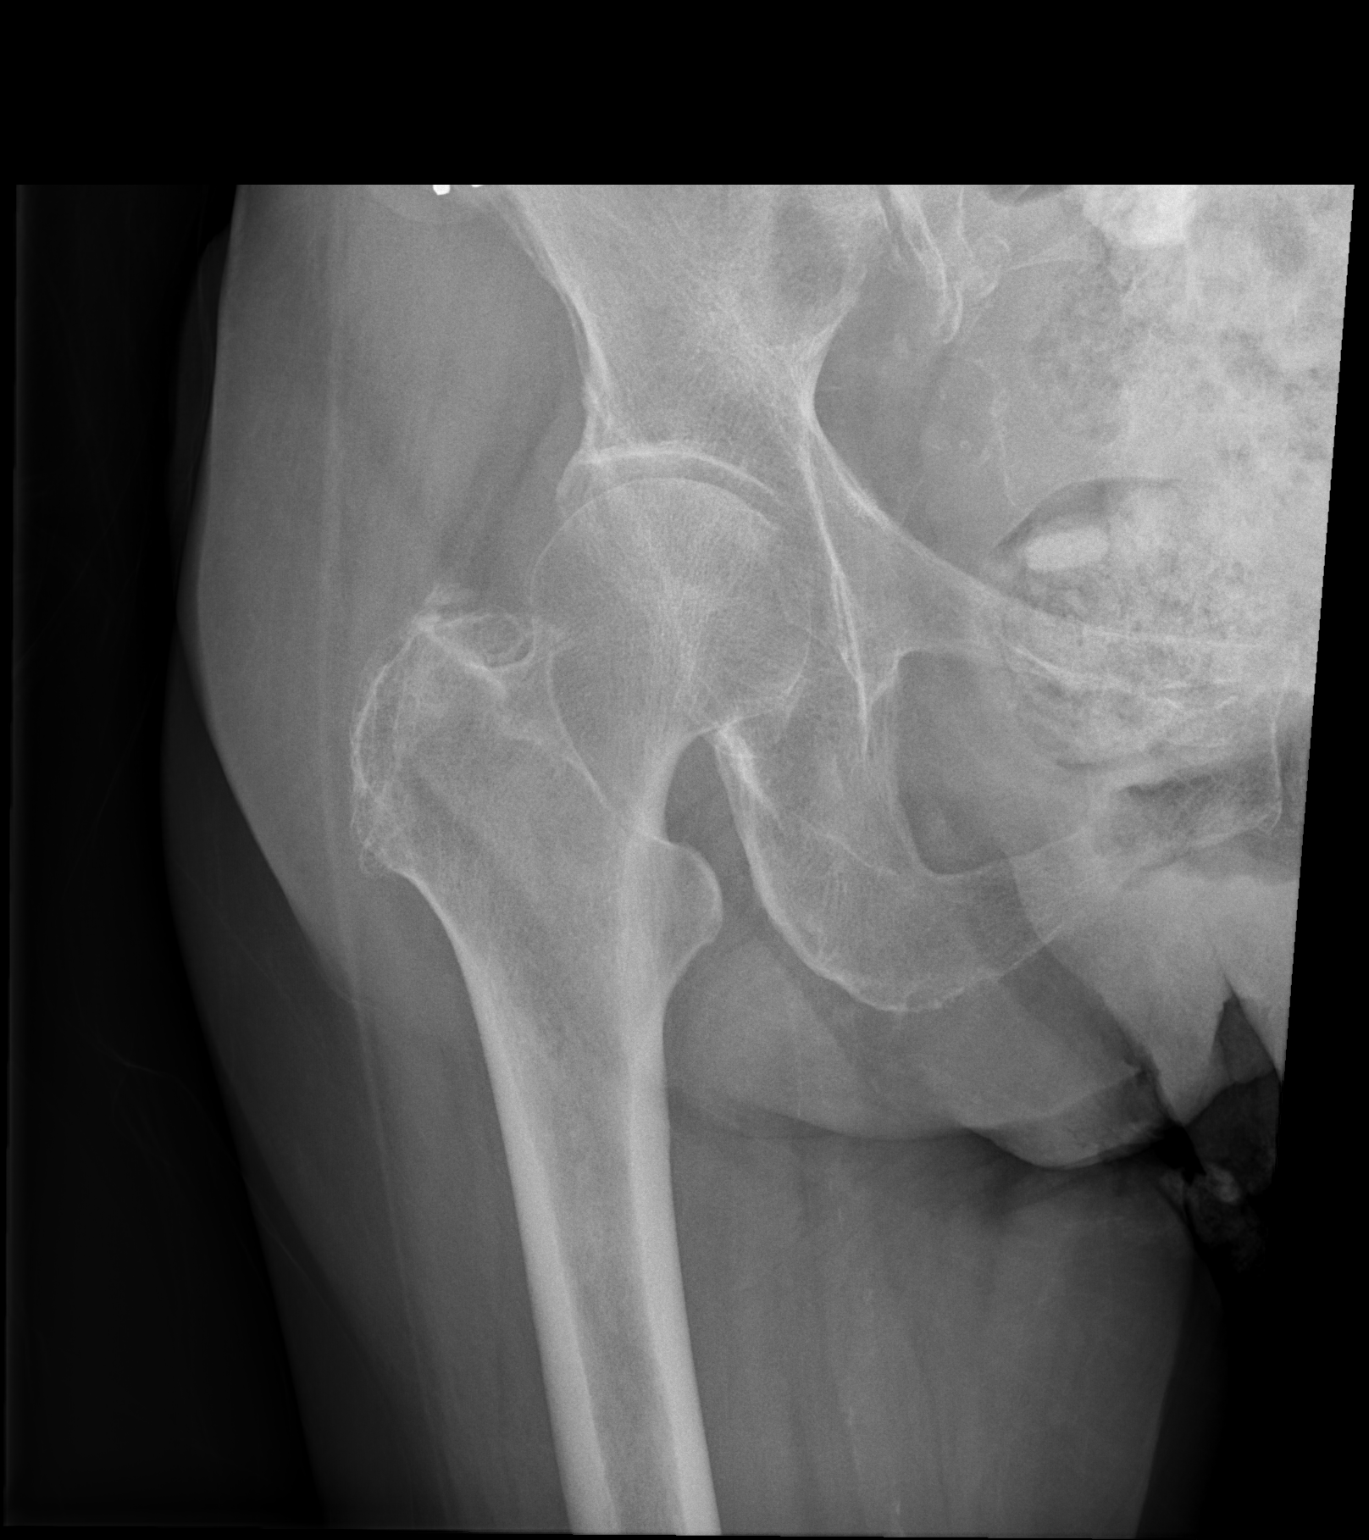
[im 2/2]
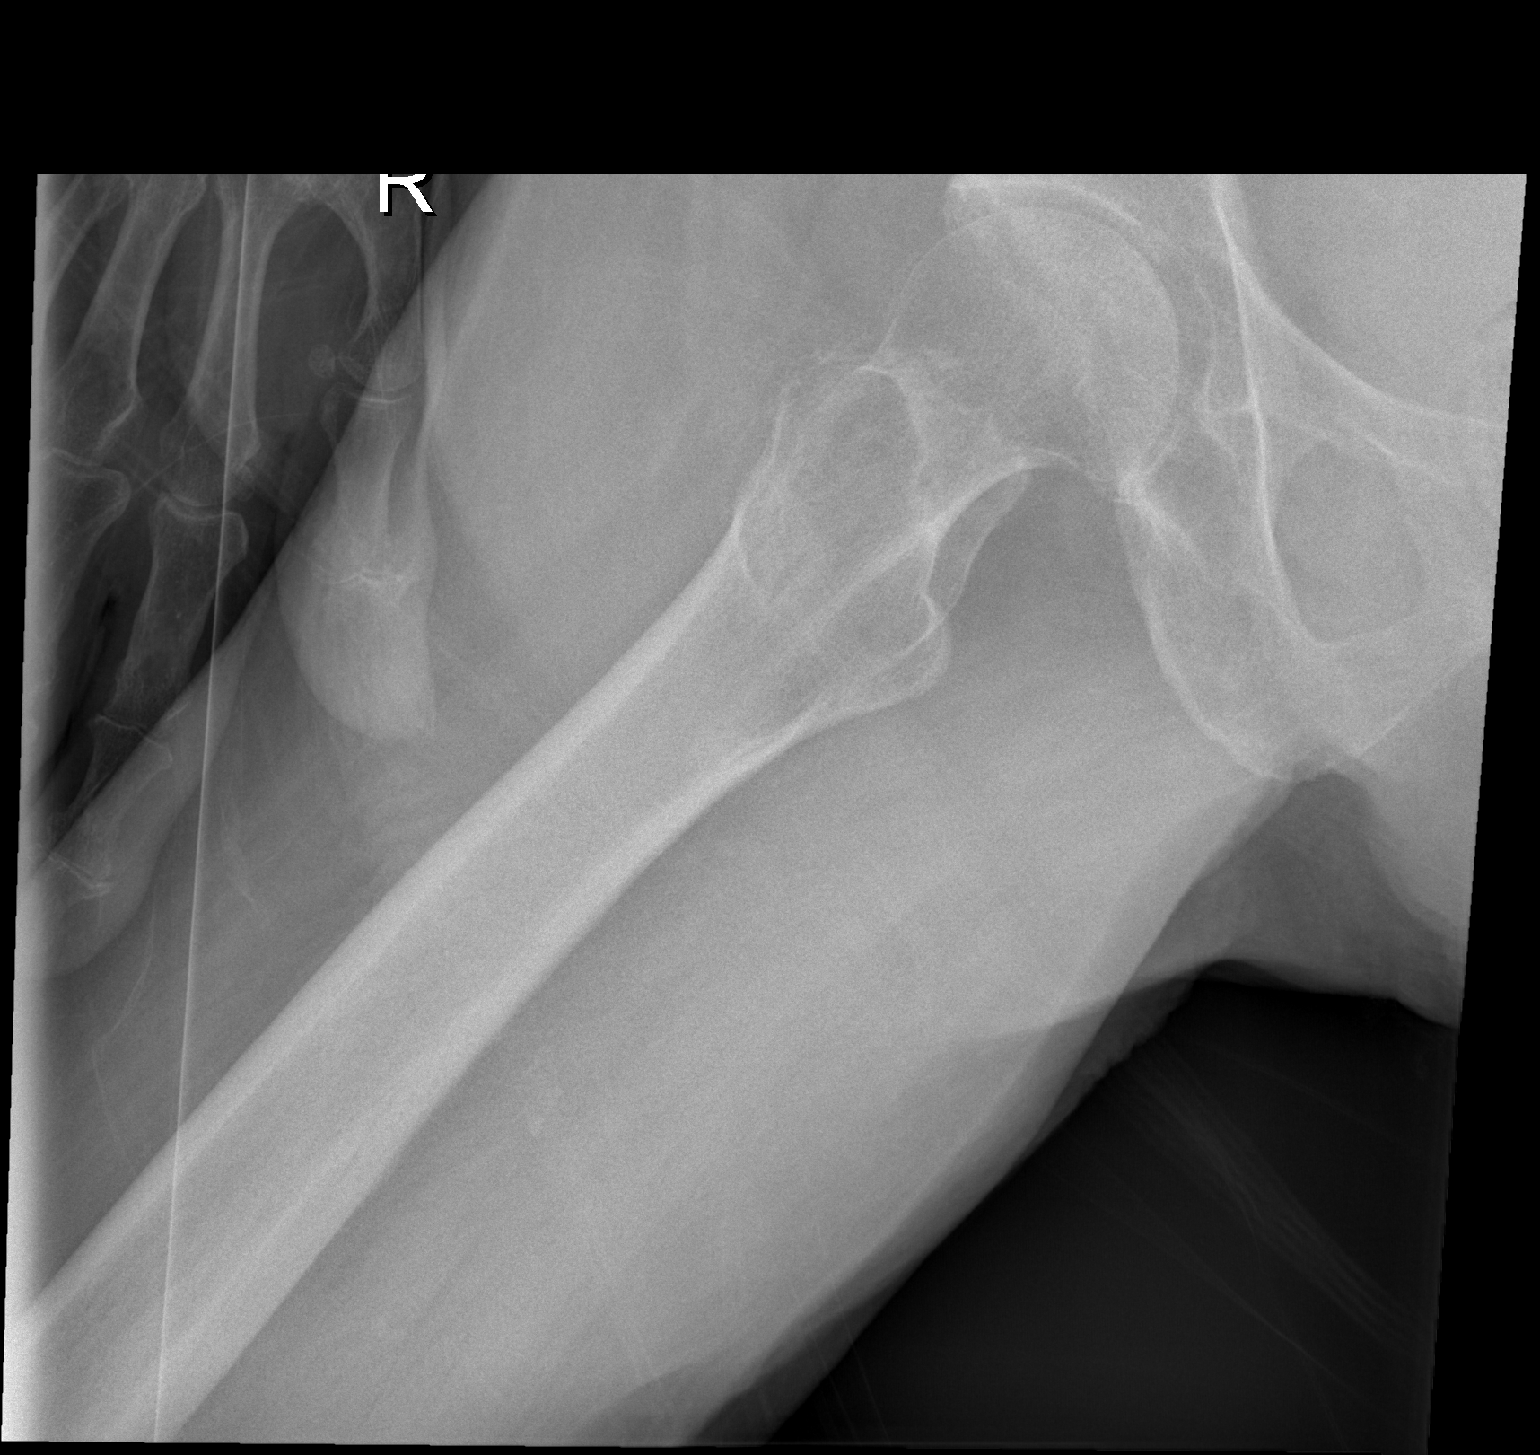

[2 of 2 positions shown; findings below may reference images not displayed]

FINDINGS: No acute fracture seen. Subtle lucency in the right iliac wing medially
likely correlates with the lytic lesion seen on the PET CT of 02/01/2013
IMPRESSION: No hip fracture seen. If there is continued clinical concern for hip
fracture, further evaluation could be provided with CT or MRI.

[REDACTED]

## 2013-06-29 IMAGING — CR DG LUMBAR SPINE 2-3V
1 series · 3 of 3 positions shown · non-contrast
Comparison: none

REASON FOR EXAM: low back pain
COMMENTS:

PROCEDURE:     DXR - DXR LUMBAR SPINE AP AND LATERAL  - March 28, 2013  [DATE]
RESULT:     Comparison: None.

[Series 1: t lumbar spine ap · 0.14mm/px · 3 of 3 slices shown]
[im 1/3]
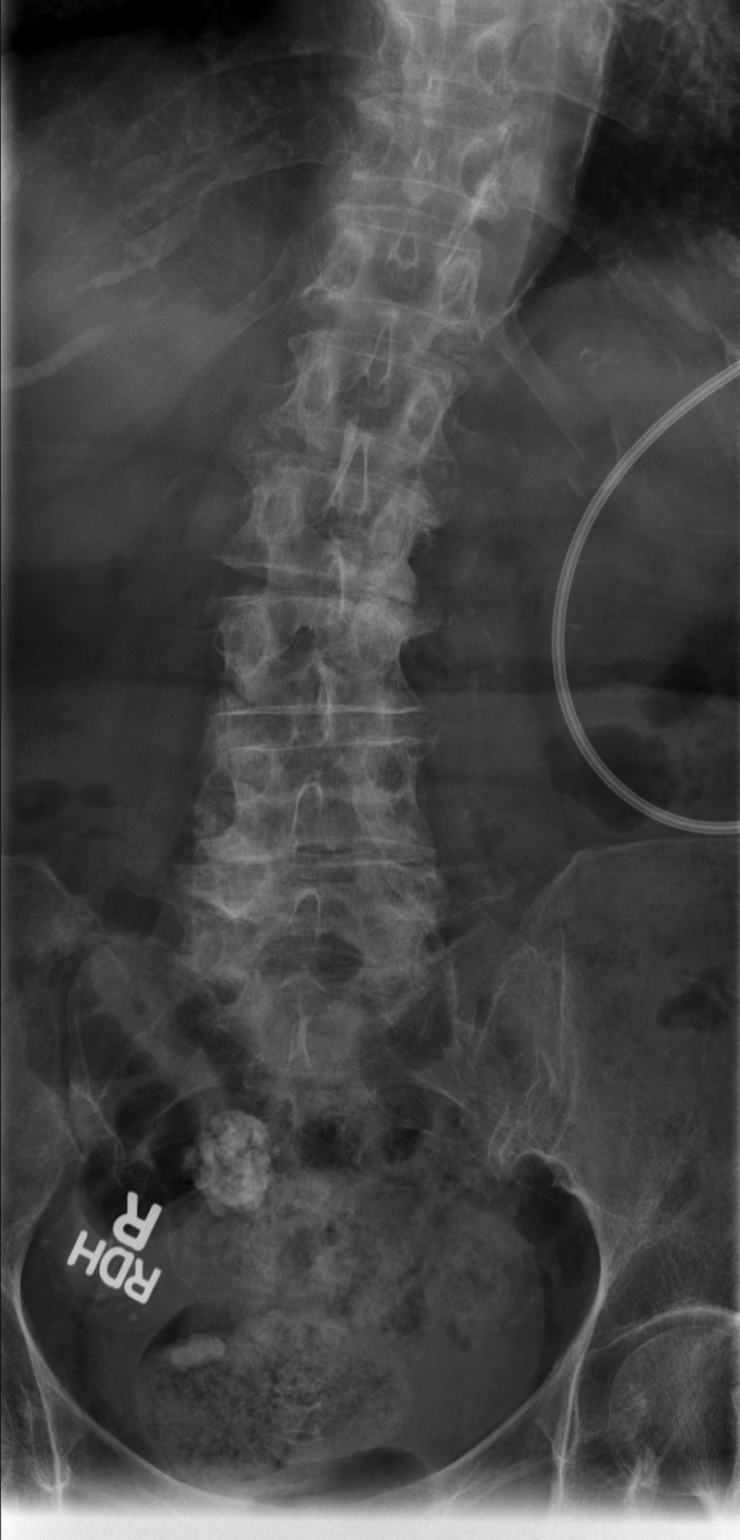
[im 2/3]
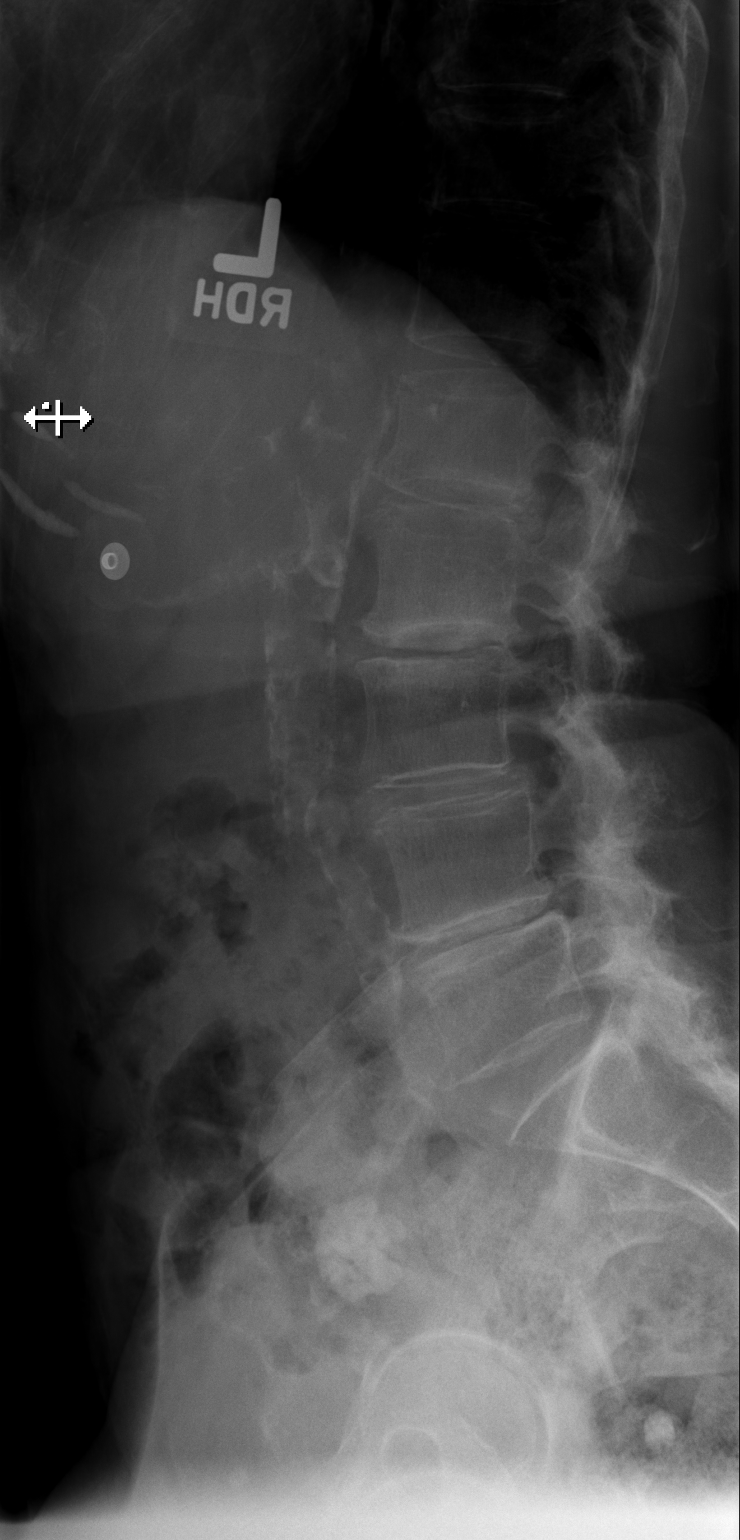
[im 3/3]
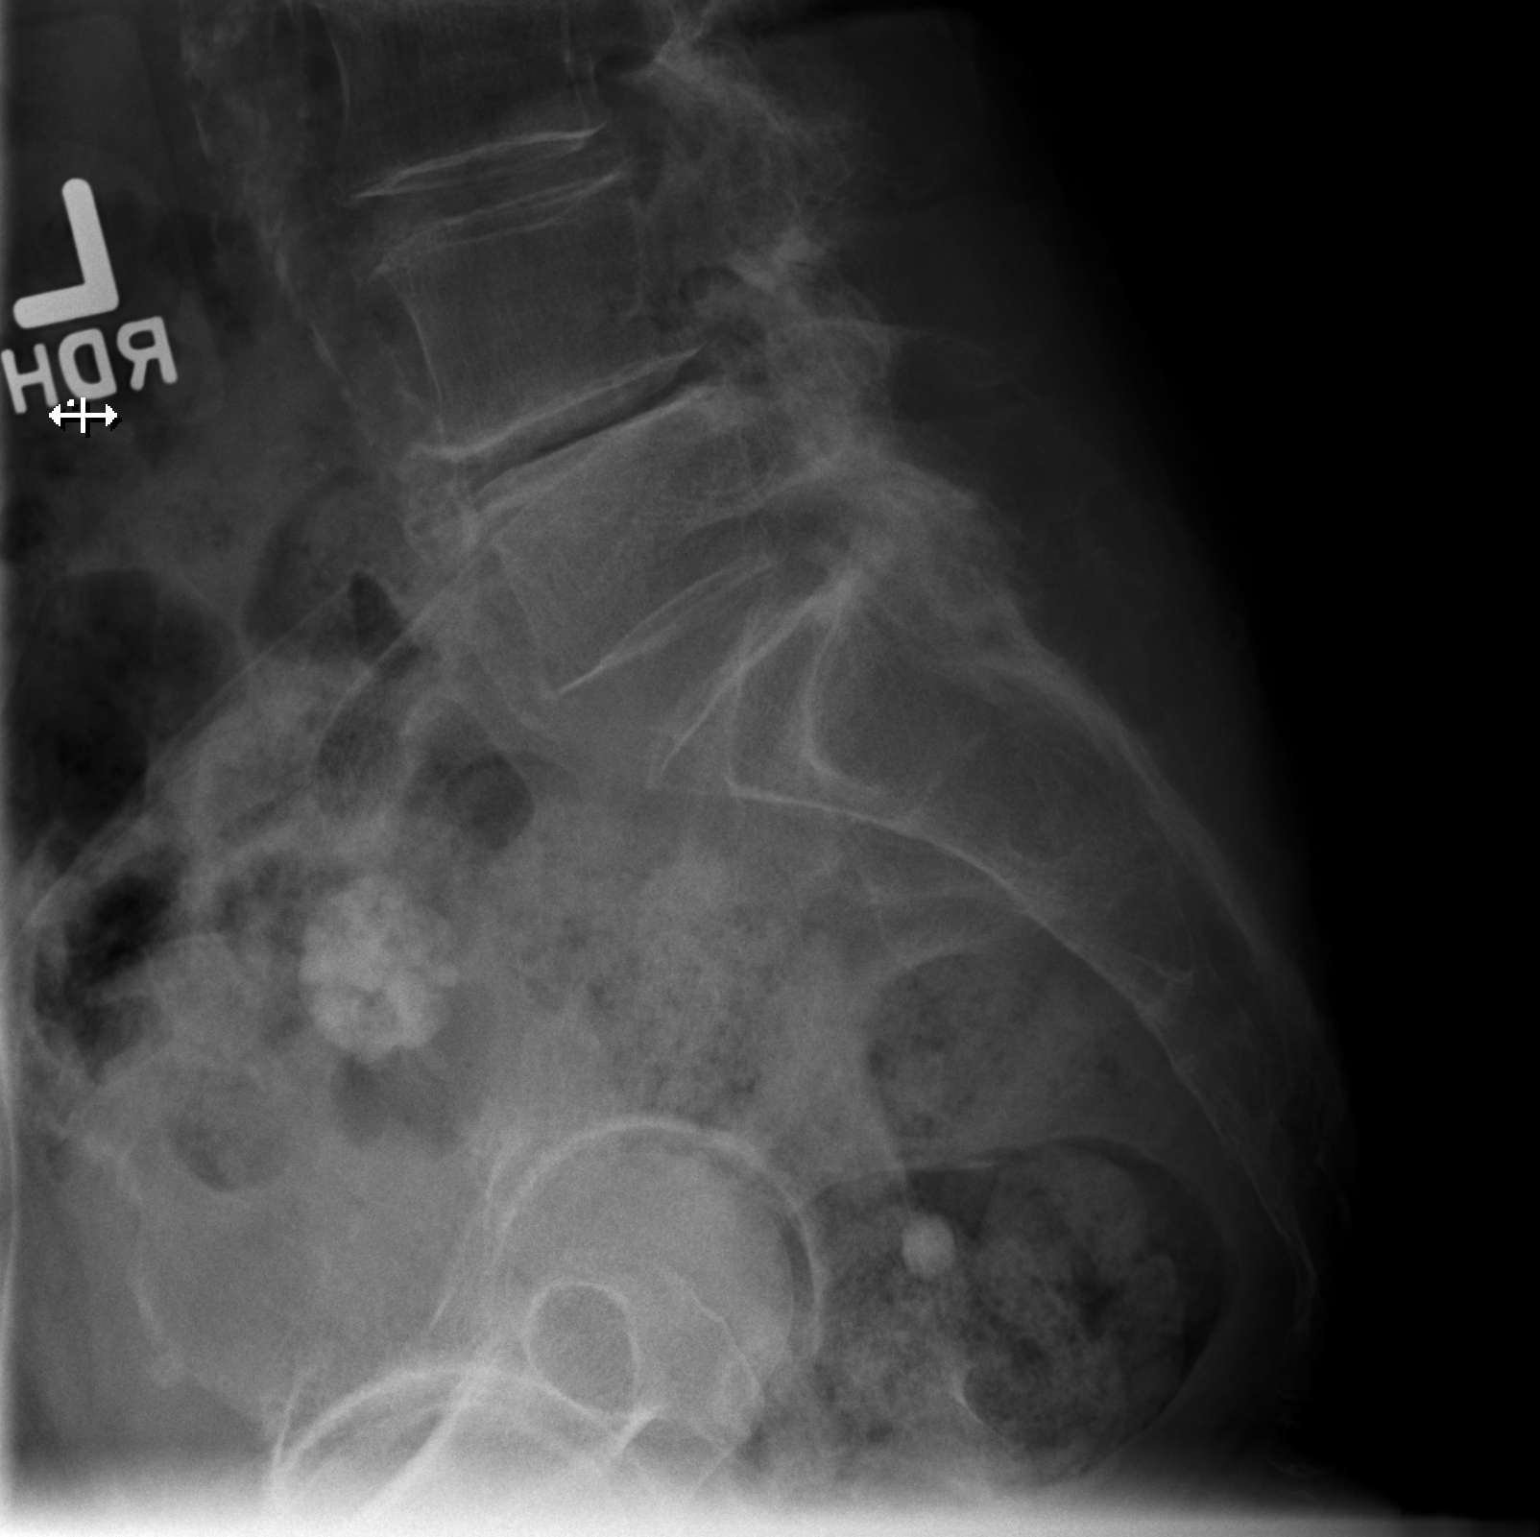

[3 of 3 positions shown; findings below may reference images not displayed]

FINDINGS: There are 5 lumbar type vertebral bodies. There is slight dextrocurvature of
the lumbar spine. Vertebral body heights are relatively preserved.  Mild
degenerative disc disease is seen at L1-L2, L2-L3, L3-L4, and L4-L5. There
is slight anterolisthesis of L3 on L4.
IMPRESSION: No acute findings.

[REDACTED]

## 2014-09-05 ENCOUNTER — Encounter: Payer: Self-pay | Admitting: General Surgery

## 2015-02-24 NOTE — H&P (Signed)
PATIENT NAME:  Meredith Allen, Meredith Allen MR#:  [id number] DATE OF BIRTH:  April 19, 1923  RENAL HOSPITALIZATION ADMISSION HISTORY AND PHYSICAL  DATE OF ADMISSION:  03/28/2013  REFERRING PHYSICIAN: Dr. Lisa Roca.   CHIEF COMPLAINT: Lower extremity weakness; newly-found basilar skull mass   HISTORY OF PRESENT ILLNESS: The patient is an 81 year old Caucasian female with a past medical history of hypertension, hyperlipidemia, iron deficiency anemia, colonic polyps, COPD, glaucoma, right ankle surgery, MGUS, CHF, stage IV metastatic carcinoma of unknown primary diagnosed by needle biopsy of the iliac mass, who presented to [facility] with progressive lower extremity weakness. The patient is unable to offer a significant history as she is a bit lethargic at present. History offered by the patient's daughter.   According to the patient's daughter she was recently diagnosed with stage IV metastatic carcinoma of unknown primary, diagnosed by needle biopsy of an iliac mass on 02/11/2013. Over the past week she has experienced increasing lower extremity weakness. The patient did have some radiation therapy to treat the right scapula. The patient's p.o. intake has also been decreased. She has been taking Megace at home.   The patient had a CT scan of the head today without contrast which shows a destructive mass involving the base of the skull to the left of midline. The Emergency Room physician had a discussion with the patient's family, and it appears that they would like to pursue comfort care as well as hospice services at this point in time.   PAST MEDICAL HISTORY: 1.  Hypertension.   2.  Hyperlipidemia. 3. Iron deficiency anemia.  4.  Colonic polyps.  5.  COPD.  6.  Glaucoma.  7.  Right ankle surgery.  8.  MGUS.  9.  CHF.  10.  Metastatic carcinoma of unknown primary, diagnosed by needle biopsy of iliac mass on 02/11/2013.   HOME MEDICATIONS:  1.  Omeprazole 20 mg p.o. daily.  2.  Norco 5/325,  1 tablet p.o. every 8 to 12 hours.  3.  Metoprolol 25 mg p.o. daily.  4. Megace 40 mg/mL, 20 mg once a day.  5.  Magnesium oxide 400 mg p.o. daily.  6.  Lotrel 5/20 mg p.o. daily.  7.  Furosemide 20 mg p.o. daily.  8.  Fish oil 4 grams p.o. daily.  9.  Fiber supplement, daily.  10.  Ferrous sulfate 325 mg p.o. t.i.d.  11. Eliquis 2.5 mg p.o. b.i.d.  12. Digoxin 125 mcg p.o. daily.  13.  Cosopt 2 drops to each affected eye b.i.d.  14. Colace 100 mg p.o. b.i.d.   ALLERGIES: CONTRAST DYE, IODINE, AND SHELLFISH.   SOCIAL HISTORY: The patient lives in [place]. She is widowed. She has 5 living children. She used to work in a Special educational needs teacher. She quit tobacco over 20 years ago. No alcohol or illicit drug use.   FAMILY HISTORY: Mother deceased secondary to myocardial infarction. Father deceased secondary to myocardial infarctions.   REVIEW OF SYSTEMS: Unable to obtain from the patient as she is quite lethargic at this point in time, and unable to fully concentrate on questions.   PHYSICAL EXAMINATION: VITAL SIGNS: Temperature 97.6, pulse 104, respirations 22, blood pressure 136/87.  GENERAL: Reveals a cachectic-appearing female, currently in no acute distress.  HEENT: Normocephalic, atraumatic. Spontaneous extraocular movements were noted. Right pupil was slightly larger than the left. No epistaxis noted. Difficult to assess hearing. Oral mucosa are dry.  NECK: Supple, without JVD or lymphadenopathy.   LUNGS: Are clear to auscultation  bilaterally, with normal respiratory effort.  CARDIOVASCULAR: S1, S2. Noted to be irregular, 2/6 systolic ejection murmur heard.  ABDOMEN: Soft, nontender, nondistended. Bowel sounds positive. No rebound or guarding. No gross organomegaly appreciated.  EXTREMITIES: No clubbing, cyanosis, or edema noted. Extremities are cool to the touch.  SKIN: Warm and dry. No rashes noted.  MUSCULOSKELETAL: No joint redness, swelling or tenderness appreciated.   NEUROLOGIC: The patient is lethargic, but is arousable. She did follow a few simple commands.  PSYCHIATRIC: Unable to fully assess at this time.   LABORATORY DATA: CT head without contrast shows destructive mass involving the base of the skull to the left of midline.   CT pelvis shows a destructive lesion involving the posterior right iliac wing, as similar to before.   Urinalysis shows 1+ leukocyte esterase, RBCs 1 per high-power field, 12 WBCs per high-power field.   Pelvic x-ray: No definitive fracture.   Right hip x-ray: No hip fracture noted.   Left hip x-ray shows no acute fracture seen.   Lumbar spine x-ray shows no acute findings.   BMP shows sodium 143, potassium 3.7, chloride 110, CO2 20, BUN 20, creatinine 0.93, glucose 144, eGFR of 55.   CBC shows a WBC of 14.8, hemoglobin 7.9, hematocrit 43, platelets 381.   IMPRESSIONS: This is an 81 year old Caucasian female with a past medical history of hypertension, hyperlipidemia, iron deficiency anemia, colonic polyps, chronic obstructive pulmonary disease, glaucoma, right ankle surgery, monoclonal gammopathy of undetermined significance (MGUS), congestive heart failure, atrial fibrillation, who presented to [facility] with progressive lower extremity weakness and now found to have metastatic disease, with a destructive mass involving the base of the skull to the left of midline.  1.  Lower extremity weakness, progressive.  2.  Carcinoma of undetermined primary, with radiation therapy recently to the right scapula.  3.  Atrial fibrillation.  4.  Hypertension.  5.  Chronic kidney disease, stage III.   PLAN: The patient has a recent history of carcinoma of undetermined primary. The patient had an iliac mass biopsy and also had radiation therapy. However, she appears to have a new basilar skull mass.   I had a long discussion with the patient's family today. They request comfort care measures at this point in time.  The patient's daughter and son are the healthcare power of attorney. We will therefore enter comfort care measures.   We will stop many of her home medications, including Eliquis at this point in time. We also discussed the possibility of nutrition. We will not provide IV fluids, but will offer the patient a full-liquid diet. We will also place a palliative care consult for the patient to see if she is eligible for the hospice home.  Time spent: One hour.    ____________________________ Tama High, MD [id number] D: 03/28/2013 13:26:54 ET T: 03/28/2013 13:57:40 ET JOB#: [id number]  cc: Tama High, MD, <Dictator> Mariah Milling Raiyan Dalesandro MD ELECTRONICALLY SIGNED 04/21/2013 10:48

## 2015-02-24 NOTE — Consult Note (Signed)
Reason for Visit: This 81 year old Female patient presents to the clinic for initial evaluation of  metastatic cancer presumably lung .   Referred by Dr. Ma Hillock.  Diagnosis:  Chief Complaint/Diagnosis   81 year old female with destruction of her right scapula as well as right SI joint from presumed primary lung cancer with PET positive disease in the mediastinum biopsy of right hip metastasis positive for poorly differentiated carcinoma  Pathology Report pathology report reviewed   Imaging Report PET/CT scan reviewed   Referral Report clinical notes reviewed   Planned Treatment Regimen palliative radiation therapy   HPI   patient is an 81 year old female who presented to orthopedic surgeon for increasing pain in her right hip found on plain x-ray to have a markedly destructive lesion of the right SI joint as well as right scapula. She underwent a fine-needle aspiration of the right iliac lesion which was positive for poorly differentiated carcinoma carotin special stains were positive. PET/CT scan shows right hilar hypermetabolic activity as well as mediastinal hypermetabolic activity as well as uptake in the right scapula and right SI joint region. She is on narcotic analgesics which is helped with the pain. I been asked to evaluate the patient for possible palliative radiation treatment. Patient is recently been noted to have dye cubitus ulcers and health care staff is aware of that. She also set a recent MRI showing infarct and she is being followed by neurology.  Past Hx:    Skin Cancer Resection:    Monoclonal paraproteinemia: Followed by Dr Ma Hillock   Macular Degeneration:    Chronic Kidney Disease: Followed by Dr Holley Raring   Chronic Anemia:    GERD - Esophageal Reflux:    Peripheral Vascular Disease: with bilateral carotid artery stenosis   Cardiomyopathy: EF 45-50%   Atrial Fibrillation: Chronic--not on anticoagulation due to hx of GI bleed   hemorrhoids:    hx of  tobacco use-now stopped smoking 20 yrs ago:    arthritis:    glaucoma:    cataracts:    Hypertension:    Glaucoma surgery:    Cataract Extraction:    Ankle surgery:    Bilateral Breast Lumpectomy: Noncancerous  Past, Family and Social History:  Past Medical History positive   Cardiovascular atrial fibrillation; hypertension; peripheral vascular disease; cardiomyopathy   Gastrointestinal GERD   Genitourinary chronic kidney disease   Past Surgical History bilateral lumpectomies, ankle surgery, cataract excision, glucometer surgery   Past Medical History Comments arthritis, hemorrhoids, monoclonal paraproteinemia   Family History noncontributory   Social History positive   Social History Comments 40-pack-year smoking history quit smoking 20 years prior   Additional Past Medical and Surgical History accompanied by 2 daughters today   Allergies:   Iodine: Rash  Contrast dye: Hives, Itching, Rash  Shellfish: Itching, Rash  Home Meds:  Home Medications: Medication Instructions Status  dexamethasone 4 mg oral tablet Take 2 tablets (8 mg) orally at 10PM the night before and 2 tablets (8 mg) at 6AM the morning of each Taxol chemotherapy (first dose of Taxol is planned for 03/17/13) Active  MS Contin 15 mg/12 hr oral tablet, extended release 1 tab(s) orally every 12 hours Active  megestrol 40 mg/mL oral suspension 20 milliliter(s) orally once a day Active  Metoprolol Succinate ER 25 mg oral tablet, extended release 1 tab(s) po once a day  Active  ferrous sulfate 325 mg (65 mg elemental iron) oral enteric coated tablet 1 tab(s)  3 times a day  Active  Magnesium Oxide  400 mg po daily  Active  Fish oil 1200 mg 3 day. 2 cap(s) orally 2 times a day Active  Calcium 600 D 3 cap(s) orally once a day Active  Advair 250 50 2 puffs day.  Active  Omeprazole 20 mg one day.  Active  Tylenol 325 mg oral tablet 1 cap(s) orally once a day, As Needed Active  Lipitor 10 mg oral tablet 1  tab(s) orally once a day (at bedtime) PATIENT HAS NOT STARTED YET Active  Lyrica 50 mg oral capsule 1 cap(s) orally 2 times a day Active  Fiber Choice 1.5 g oral tablet, chewable 5  orally once a day Active  Norco 325 mg-5 mg oral tablet 1 tab(s) orally 3 times a day, As Needed Active  Eliquis 2.5 mg oral tablet 1 tab(s) orally 2 times a day Active  digoxin 125 mcg (0.125 mg) tablet 1 tab(s) orally once a day  Active  multivitamin Multiple Vitamins capsule 1 cap(s) orally once a day  Active  furosemide 20 mg tablet 1 tab(s) orally once a day  Active  Lotrel 5 mg-20 mg oral capsule 1 cap(s) orally once a day Active   Review of Systems:  General negative   Performance Status (ECOG) 1   Skin negative   Breast negative   Ophthalmologic negative   ENMT negative   Respiratory and Thorax negative   Cardiovascular negative   Gastrointestinal negative   Genitourinary negative   Musculoskeletal negative   Neurological negative   Psychiatric negative   Hematology/Lymphatics negative   Endocrine negative   Allergic/Immunologic negative   Nursing Notes:  Nursing Vital Signs and Chemo Nursing Nursing Notes: *CC Vital Signs Flowsheet:   05-May-14 15:19  Temp Temperature 96.8  Pulse Pulse 99  Respirations Respirations 18  SBP SBP 146  DBP DBP 68  Pain Scale (0-10)  8 right shoulder  Current Weight (kg) (kg) 49.6   Physical Exam:  General/Skin/HEENT:  General normal   Skin normal   Eyes normal   ENMT normal   Head and Neck normal   Additional PE well-developed wheelchair-bound female in NAD. Range of motion of her right shoulder doesn't elicit significant pain. Lungs are clear to A&P she does have an irregularly irregular heartbeat. Abdomen is benign. Range of motion of her lower extremities elicits pain more in her shoulder that her lower back. No peripheral edema in her lower extremities is noted.   Breasts/Resp/CV/GI/GU:  Respiratory and Thorax normal    Cardiovascular normal   Gastrointestinal normal   Genitourinary normal   MS/Neuro/Psych/Lymph:  Musculoskeletal normal   Neurological normal   Lymphatics normal   Other Results:  Radiology Results: LabUnknown:    31-Mar-14 14:18, xxxPET/CT DX MYELOMA  xxxPET/CT DX MYELOMA   REASON FOR EXAM:    abnormal MRI  metastatic carcinoma  COMMENTS:       PROCEDURE: PET - PET/CT DX MYELOMA  - Feb 01 2013  2:18PM     RESULT: Indication: Metastatic carcinoma  Radiopharmaceutical: 12.51 mCi F18-FDG, intravenously.    Technique: Imaging was performed from the skull base to the mid-thigh   using routine PET/CT acquisition protocol.    Injection site: Left antecubital  Time of FDG injection: 1243 hours  Serum glucose: 107 mg/dL   Time of imaging: 1348 hours  Comparison studies: NONE    Findings:    HEAD AND NECK:     There is heterogeneous enlargement of the right thyroid lobe with mild   associated hypermetabolic activity as  can be seen with thyroiditis or a   thyroid goiter.    CHEST:    The lungs are clear. There is no focal parenchymal opacity, pleural   effusion, or pneumothorax. There bilateral emphysematous changes.  The heart size is normal. There is no pericardial effusion. There is   coronary artery atherosclerosis.    There is a hypermetabolic anterior mediastinallymph node measuring 8 mm   with an SUV max of 2.There is a right precarinal lymph node measuring 16   mm in short axis with an SUV max of 8.6 and an SUV average of 5 . There   is hypermetabolic right hilar lymphadenopathy.    There is a hypermetabolic right scapular mass measuring 3.9 cm with an   SUV max of 13.8 and an SUV average of 8.7 .    ABDOMEN/PELVIS:    The liver demonstrates no focal abnormality. The gallbladder is   unremarkable. The spleen demonstrates no focal abnormality. The kidneys,     adrenal glands, pancreas are normal. The bladder is unremarkable.     The unopacified bowel is  unremarkable. There is no pneumoperitoneum,   pneumatosis, or portal venous gas. There is no abdominal or pelvic free   fluid. There is no lymphadenopathy.     The abdominal aorta is normal in caliber.    There is a hypermetabolic soft tissue mass with associated bone   destruction involving the right posterior ilium measuring 2.8 x 4.2 cm.    IMPRESSION:     1. Large soft tissue masses with associated bone destruction involving   the right scapula and right posterior ilium concerning for metastatic     disease versus multiple myeloma. It recommend tissue diagnosis. The right   posterior iliac lesion is amenable to percutaneous Biopsy.    2. Hypermetabolic anterior mediastinal, right lower paratracheal and   right hilar lymphadenopathy concerning for malignancy.    2. There is heterogeneous enlargement of the right thyroid lobe with mild   associated hypermetabolic activity as can be seenwith thyroiditis or a   thyroid goiter.    Dictation Site: 1        Verified By: Jennette Banker, M.D., MD  PACS Image     08-Apr-14 10:28, MRI Brain Without Contrast  PACS Image   MRI:  MRI Brain Without Contrast   REASON FOR EXAM:    left face numbness headache  eval for brain mets  COMMENTS:       PROCEDURE: MR  - MR BRAIN WO CONTRAST  - Feb 09 2013 10:28AM     RESULT: History: Left facial numbness    Technique: Multiplanar, multisequence MRI of the brain wasobtained   without IV contrast.     Comparison:  None    Findings:     There is a punctate area of restricted diffusion in the right cerebellum,     left cerebellum and right posterior parietal lobe concerning for   acute-subacute infarcts. There is no hemorrhage. There is no pathologic   extra-axial fluid collection. There is generalized cerebral atrophy.   There is periventricular and deep white matter T2 and FLAIR   hyperintensity likely secondary to microangiopathy. There is no   hydrocephalus. The ventricles are  normal for age. The basal cisterns are   patent. There are no abnormal extra-axial fluid collections.     There is left mastoid mucosal thickening concerning for mastoiditis. The   skull base and calvarium demonstrate normal signal. The major  intracranial flow voids, including dural venous sinuses appear patent.    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    Left mastoiditis.    Dictation Site: 1        Verified By: Jennette Banker, M.D., MD   Relevent Results:   [facility] and Labs PET/CT scan MRI scan of both reviewed.   Assessment and Plan: Impression:   metastatic disease from probable lung primary with involvement of right scapula as well as right SI joint Plan:   at this time I like to go ahead with palliative radiation therapy to her right scapula. We'll plan on delivering 3000 cGy in 10 fractions. I have set up simulation today to start pain management as soon as possible. We'll continue to evaluate her decubitus ulcers may go ahead and treat her right SI joint with palliative treatment should she tolerated her initial treatment well. Risks and benefits of treatment including skin reaction were discussed in detail with the patient's family. They have consented to treatment. Simulation was performed today.  I would like to take this opportunity to thank you for allowing me to continue to participate in this patient's care.  CC Referral:  cc: Dr. Miguel Aschoff   Electronic Signatures: Baruch Gouty Roda Shutters (MD)  (Signed 06-May-14 15:04)  Authored: HPI, Diagnosis, Past Hx, PFSH, Allergies, Home Meds, ROS, Nursing Notes, Physical Exam, Other Results, Relevent Results, Encounter Assessment and Plan, CC Referring Physician   Last Updated: 06-May-14 15:04 by Armstead Peaks (MD)

## 2015-02-24 NOTE — Discharge Summary (Signed)
PATIENT NAME:  Meredith Allen, Meredith Allen MR#:  [id number] DATE OF BIRTH:  1923/04/19  DATE OF ADMISSION:  03/28/2013 DATE OF DISCHARGE:  03/30/2013  DISCHARGE DIAGNOSES: 1.  Metastatic cancer from unknown primary.  2.  Failure to thrive.  3.  Hypertension.  4.  Atrial fibrillation.  5.  Bilateral lower extremity weakness.  CONSULTANTS: Dr. Ermalinda Memos of palliative care.   IMAGING STUDIES:  Done included CT of the head without contrast, which showed a destructive mass involving base of skull to left of midline. Otherwise no acute intracranial findings.   CT of the pelvis without contrast showed no acute fracture, destructive lesion involving posterior right iliac wing similar to slightly increased from prior head CT.   Pelvis AP showed no definite fracture.   Lumbar spine, AP and lateral, showed no acute fracture.   ADMITTING HISTORY AND PHYSICAL AND HOSPITAL COURSE: Please see detailed H and P dictated by Dr. Holley Allen on 03/28/2013. An 81 year old Caucasian female patient with past medical history of hypertension, hyperlipidemia, iron deficiency anemia, stage IV metastatic cancer of unknown primary who presented to the hospital complaining of lower extremity weakness. The patient was admitted to the hospital after she was found to have a new found basilar skull mass.   HOSPITAL COURSE: The patient was admitted onto the medical floor, started on supportive care.  On further discussion with the family, they decided to make the patient COMFORT CARE only. Palliative care was consulted for the same. Later hospice screen was done and the patient was discharged to hospice home, as per family's request, to keep the patient comfortable at end of life.  The patient does have terminal  malignancy stage IV with unknown primary with worsening.   On the day of discharge, the patient is comfortable, saturating 98% on room air.   DISCHARGE MEDICATIONS: 1.  Morphine 20 mg/mL 0.25 mL every 1 hour as needed for pain.  2.   Atrovent 2 drops orally every hour as needed for secretions.  3.  Lorazepam 0.5 mg oral 1 to 2 tablets every 2 to 4 hours as needed for agitation or anxiety.   DISCHARGE INSTRUCTIONS: Further management as per hospice home.   TIME SPENT: On day of discharge in discharge activity was 35 minutes.  ____________________________ Meredith Alf Dimitra Woodstock, MD srs:sb D: 03/30/2013 14:06:14 ET T: 03/30/2013 14:41:18 ET JOB#: [id number]  cc: Meredith Heimlich R. Meilech Virts, MD, <Dictator> Meredith Carp MD ELECTRONICALLY SIGNED 04/05/2013 23:30
